# Patient Record
Sex: Female | Born: 2000 | Hispanic: Yes | Marital: Single | Smoking: Never smoker
Health system: Southern US, Community
[De-identification: ages and names within clinical notes are randomized; demographics above are authoritative.]

## PROBLEM LIST (undated history)

## (undated) DIAGNOSIS — O1410 Severe pre-eclampsia, unspecified trimester: Secondary | ICD-10-CM

## (undated) DIAGNOSIS — T7840XA Allergy, unspecified, initial encounter: Secondary | ICD-10-CM

## (undated) DIAGNOSIS — F419 Anxiety disorder, unspecified: Secondary | ICD-10-CM

## (undated) DIAGNOSIS — E669 Obesity, unspecified: Secondary | ICD-10-CM

## (undated) DIAGNOSIS — F32A Depression, unspecified: Secondary | ICD-10-CM

## (undated) DIAGNOSIS — T7421XA Adult sexual abuse, confirmed, initial encounter: Secondary | ICD-10-CM

## (undated) HISTORY — PX: NO PAST SURGERIES: SHX2092

## (undated) HISTORY — DX: Severe pre-eclampsia, unspecified trimester: O14.10

---
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
## 2022-01-27 ENCOUNTER — Ambulatory Visit (INDEPENDENT_AMBULATORY_CARE_PROVIDER_SITE_OTHER): Payer: Medicaid Other

## 2022-01-27 DIAGNOSIS — Z3201 Encounter for pregnancy test, result positive: Secondary | ICD-10-CM | POA: Diagnosis not present

## 2022-01-27 DIAGNOSIS — Z3200 Encounter for pregnancy test, result unknown: Secondary | ICD-10-CM

## 2022-01-27 DIAGNOSIS — Z3490 Encounter for supervision of normal pregnancy, unspecified, unspecified trimester: Secondary | ICD-10-CM

## 2022-01-27 LAB — POCT PREGNANCY, URINE: Preg Test, Ur: POSITIVE — AB

## 2022-01-27 NOTE — Progress Notes (Signed)
Possible Pregnancy  Here today for pregnancy confirmation. UPT in office today is positive. Pt reports first positive home UPT 2 weeks ago.  Reviewed dating with patient:   LMP: 12/28/21 approx Dating [place] scheduled for 02/10/22  Reports left lower back pain intermittently over past 4 days. Patient reports pain has ranged from 6-8 on pain scale out of 10. Reviewed need to present to MAU if pain returns or with any vaginal bleeding due to risk for ectopic pregnancy vs miscarriage. OB history reviewed; single vaginal delivery 05/22/21. Reviewed medications and allergies with patient; list of medications safe to take during pregnancy given. Will schedule prenatal care following dating [place].  Marjo Bicker, RN 01/27/2022  1:20 PM

## 2022-01-27 NOTE — Patient Instructions (Signed)

## 2022-02-04 ENCOUNTER — Inpatient Hospital Stay (HOSPITAL_COMMUNITY)
Admission: AD | Admit: 2022-02-04 | Discharge: 2022-02-04 | Disposition: A | Discharge status: Home / Self Care | Payer: Medicaid Other | Attending: Obstetrics and Gynecology | Admitting: Obstetrics and Gynecology

## 2022-02-04 ENCOUNTER — Inpatient Hospital Stay (HOSPITAL_COMMUNITY): Payer: Medicaid Other

## 2022-02-04 ENCOUNTER — Encounter (HOSPITAL_COMMUNITY): Payer: Self-pay | Admitting: *Deleted

## 2022-02-04 DIAGNOSIS — Z3A10 10 weeks gestation of pregnancy: Secondary | ICD-10-CM | POA: Insufficient documentation

## 2022-02-04 DIAGNOSIS — O23591 Infection of other part of genital tract in pregnancy, first trimester: Secondary | ICD-10-CM | POA: Diagnosis not present

## 2022-02-04 DIAGNOSIS — Z3491 Encounter for supervision of normal pregnancy, unspecified, first trimester: Secondary | ICD-10-CM | POA: Diagnosis not present

## 2022-02-04 DIAGNOSIS — M549 Dorsalgia, unspecified: Secondary | ICD-10-CM | POA: Insufficient documentation

## 2022-02-04 DIAGNOSIS — O219 Vomiting of pregnancy, unspecified: Secondary | ICD-10-CM | POA: Diagnosis not present

## 2022-02-04 DIAGNOSIS — O218 Other vomiting complicating pregnancy: Secondary | ICD-10-CM | POA: Diagnosis not present

## 2022-02-04 DIAGNOSIS — B9689 Other specified bacterial agents as the cause of diseases classified elsewhere: Secondary | ICD-10-CM | POA: Diagnosis not present

## 2022-02-04 DIAGNOSIS — O26891 Other specified pregnancy related conditions, first trimester: Secondary | ICD-10-CM | POA: Insufficient documentation

## 2022-02-04 DIAGNOSIS — O99891 Other specified diseases and conditions complicating pregnancy: Secondary | ICD-10-CM | POA: Diagnosis not present

## 2022-02-04 HISTORY — DX: Depression, unspecified: F32.A

## 2022-02-04 LAB — WET PREP, GENITAL
Sperm: NONE SEEN
Trich, Wet Prep: NONE SEEN
WBC, Wet Prep HPF POC: <10 (ref <10)
Yeast Wet Prep HPF POC: NONE SEEN

## 2022-02-04 LAB — CBC
HCT: 37.8 % (ref 36.0–46.0)
Hemoglobin: 13 g/dL (ref 12.0–15.0)
MCH: 29.3 pg (ref 26.0–34.0)
MCHC: 34.4 g/dL (ref 30.0–36.0)
MCV: 85.1 fL (ref 80.0–100.0)
Platelets: 297 10*3/uL (ref 150–400)
RBC: 4.44 MIL/uL (ref 3.87–5.11)
RDW: 13.8 % (ref 11.5–15.5)
WBC: 9.8 10*3/uL (ref 4.0–10.5)
nRBC: 0 % (ref 0.0–0.2)

## 2022-02-04 LAB — URINALYSIS, ROUTINE W REFLEX MICROSCOPIC
Bilirubin Urine: NEGATIVE
Glucose, UA: NEGATIVE mg/dL
Hgb urine dipstick: NEGATIVE
Ketones, ur: 5 mg/dL — AB
Leukocytes,Ua: NEGATIVE
Nitrite: NEGATIVE
Protein, ur: NEGATIVE mg/dL
Specific Gravity, Urine: 1.014 (ref 1.005–1.030)
pH: 6 (ref 5.0–8.0)

## 2022-02-04 LAB — HCG, QUANTITATIVE, PREGNANCY: hCG, Beta Chain, Quant, S: 127766 m[IU]/mL — ABNORMAL HIGH (ref <5)

## 2022-02-04 MED ORDER — METOCLOPRAMIDE HCL 10 MG PO TABS
10.0000 mg | ORAL_TABLET | Freq: Three times a day (TID) | ORAL | 0 refills | Status: DC | PRN
Start: 2022-02-04 — End: 2022-06-16

## 2022-02-04 MED ORDER — METRONIDAZOLE 500 MG PO TABS
500.0000 mg | ORAL_TABLET | Freq: Two times a day (BID) | ORAL | 0 refills | Status: DC
Start: 2022-02-04 — End: 2022-06-16

## 2022-02-04 MED ORDER — METOCLOPRAMIDE HCL 10 MG PO TABS
10.0000 mg | ORAL_TABLET | Freq: Once | ORAL | Status: AC
  Administered 2022-02-04: 10 mg via ORAL
  Filled 2022-02-04: qty 1
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
## 2022-02-04 NOTE — Discharge Instructions (Signed)
Return to care  If you have heavier bleeding that soaks through more than 2 pads per hour for an hour or more If you bleed so much that you feel like you might pass out or you do pass out If you have significant abdominal pain that is not improved with Tylenol    Safe Medications in Pregnancy   Acne: Benzoyl Peroxide Salicylic Acid  Backache/Headache: Tylenol: 2 regular strength every 4 hours OR              2 Extra strength every 6 hours  Colds/Coughs/Allergies: Benadryl (alcohol free) 25 mg every 6 hours as needed Breath right strips Claritin Cepacol throat lozenges Chloraseptic throat spray Cold-Eeze- up to three times per day Cough drops, alcohol free Flonase (by prescription only) Guaifenesin Mucinex Robitussin DM (plain only, alcohol free) Saline nasal spray/drops Sudafed (pseudoephedrine) & Actifed ** use only after [redacted] weeks gestation and if you do not have high blood pressure Tylenol Vicks Vaporub Zinc lozenges Zyrtec   Constipation: Colace Ducolax suppositories Fleet enema Glycerin suppositories Metamucil Milk of magnesia Miralax Senokot Smooth move tea  Diarrhea: Kaopectate Imodium A-D  *NO pepto Bismol  Hemorrhoids: Anusol Anusol HC Preparation H Tucks  Indigestion: Tums Maalox Mylanta Zantac  Pepcid  Insomnia: Benadryl (alcohol free) 25mg every 6 hours as needed Tylenol PM Unisom, no Gelcaps  Leg Cramps: Tums MagGel  Nausea/Vomiting:  Bonine Dramamine Emetrol Ginger extract Sea bands Meclizine  Nausea medication to take during pregnancy:  Unisom (doxylamine succinate 25 mg tablets) Take one tablet daily at bedtime. If symptoms are not adequately controlled, the dose can be increased to a maximum recommended dose of two tablets daily (1/2 tablet in the morning, 1/2 tablet mid-afternoon and one at bedtime). Vitamin B6 100mg tablets. Take one tablet twice a day (up to 200 mg per day).  Skin Rashes: Aveeno  products Benadryl cream or 25mg every 6 hours as needed Calamine Lotion 1% cortisone cream  Yeast infection: Gyne-lotrimin 7 Monistat 7  Gum/tooth pain: Anbesol  **If taking multiple medications, please check labels to avoid duplicating the same active ingredients **take medication as directed on the label ** Do not exceed 4000 mg of tylenol in 24 hours **Do not take medications that contain aspirin or ibuprofen    

## 2022-02-04 NOTE — MAU Note (Signed)
Tara Cohen is a 21 y.o. at [redacted]w[redacted]d here in MAU reporting: pain in left flank started last wk, getting worse. +Left CVA tendernes LMP: 4/15? After Easter. No bleeding. Onset of complaint: wk ago Pain score: 8 Vitals:   02/04/22 1542  BP: 130/79  Pulse: 61  Resp: 18  Temp: 98.5 F (36.9 C)  SpO2: 99%     Lab orders placed from triage:  see NP note

## 2022-02-05 LAB — GC/CHLAMYDIA PROBE AMP (~~LOC~~) NOT AT ARMC
Chlamydia: NEGATIVE
Comment: NEGATIVE
Comment: NORMAL
Neisseria Gonorrhea: NEGATIVE

## 2022-02-10 ENCOUNTER — Other Ambulatory Visit: Payer: Medicaid Other

## 2022-03-18 ENCOUNTER — Telehealth (INDEPENDENT_AMBULATORY_CARE_PROVIDER_SITE_OTHER): Payer: Medicaid Other | Admitting: *Deleted

## 2022-03-18 DIAGNOSIS — Z8759 Personal history of other complications of pregnancy, childbirth and the puerperium: Secondary | ICD-10-CM

## 2022-03-18 DIAGNOSIS — O1400 Mild to moderate pre-eclampsia, unspecified trimester: Secondary | ICD-10-CM | POA: Insufficient documentation

## 2022-03-18 DIAGNOSIS — F332 Major depressive disorder, recurrent severe without psychotic features: Secondary | ICD-10-CM

## 2022-03-18 DIAGNOSIS — O0990 Supervision of high risk pregnancy, unspecified, unspecified trimester: Secondary | ICD-10-CM | POA: Insufficient documentation

## 2022-03-18 DIAGNOSIS — O99340 Other mental disorders complicating pregnancy, unspecified trimester: Secondary | ICD-10-CM

## 2022-03-18 DIAGNOSIS — O09299 Supervision of pregnancy with other poor reproductive or obstetric history, unspecified trimester: Secondary | ICD-10-CM

## 2022-03-18 DIAGNOSIS — F32A Depression, unspecified: Secondary | ICD-10-CM

## 2022-03-18 DIAGNOSIS — O09899 Supervision of other high risk pregnancies, unspecified trimester: Secondary | ICD-10-CM | POA: Insufficient documentation

## 2022-03-18 DIAGNOSIS — Z3A00 Weeks of gestation of pregnancy not specified: Secondary | ICD-10-CM

## 2022-03-18 DIAGNOSIS — F411 Generalized anxiety disorder: Secondary | ICD-10-CM

## 2022-03-18 DIAGNOSIS — Z7289 Other problems related to lifestyle: Secondary | ICD-10-CM

## 2022-03-18 MED ORDER — PRENATAL VITAMINS 28-0.8 MG PO TABS: 1.0000 | ORAL_TABLET | Freq: Every day | ORAL | 11 refills | Status: DC
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
## 2022-03-18 NOTE — Patient Instructions (Signed)
  At our [facility], we work as an integrated team, providing care to address both physical and emotional health. Your medical provider may refer you to see our Behavioral Health Clinician (BHC) on the same day you see your medical provider, as availability permits; often scheduled virtually at your convenience.  Our BHC is available to all patients, visits generally last between 20-30 minutes, but can be longer or shorter, depending on patient need. The BHC offers help with stress management, coping with symptoms of depression and anxiety, major life changes , sleep issues, changing risky behavior, grief and loss, life stress, working on personal life goals, and  behavioral health issues, as these all affect your overall health and wellness.  The BHC is NOT available for the following: FMLA paperwork, court-ordered evaluations, specialty assessments (custody or disability), letters to employers, or obtaining certification for an emotional support animal. The BHC does not provide long-term therapy. You have the right to refuse integrated behavioral health services, or to reschedule to see the BHC at a later date.  Confidentiality exception: If it is suspected that a child or disabled adult is being abused or neglected, we are required by law to report that to either Child Protective Services or Adult Protective Services.  If you have a diagnosis of Bipolar affective disorder, Schizophrenia, or recurrent Major depressive disorder, we will recommend that you establish care with a psychiatrist, as these are lifelong, chronic conditions, and we want your overall emotional health and medications to be more closely monitored. If you anticipate needing extended maternity leave due to mental health issues postpartum, it it recommended you inform your medical provider, so we can put in a referral to a psychiatrist as soon as possible. The BHC is unable to recommend an extended maternity leave for mental  health issues. Your medical provider or BHC may refer you to a therapist for ongoing, traditional therapy, or to a psychiatrist, for medication management, if it would benefit your overall health. Depending on your insurance, you may have a copay or be charged a deductible, depending on your insurance, to see the BHC. If you are uninsured, it is recommended that you apply for financial assistance. (Forms may be requested at the front desk for in-person visits, via MyChart, or request a form during a virtual visit).  If you see the BHC more than 6 times, you will have to complete a comprehensive clinical assessment interview with the BHC to resume integrated services.  For virtual visits with the BHC, you must be physically in the state of [place] at the time of the visit. For example, if you live in [place], you will have to do an in-person visit with the BHC, and your out-of-state insurance may not cover behavioral health services in [place]. If you are going out of the state or country for any reason, the BHC may see you virtually when you return to [place], but not while you are physically outside of [place].    [email or web address] is a website with information to help you prepare for Labor and delivery, patient information, visitor information and more.    Here is a link to the Pregnancy Navigators . Please Fill out prior to your New OB appointment.   English Link: [email or web address]  Spanish Link: [email or web address]  

## 2022-03-19 NOTE — BH Specialist Note (Signed)
Pt did not arrive to video visit and did not answer the phone; Left HIPPA-compliant message to call back Jasemine Nawaz from [facility] at  [phone] (Romonda Parker's office).  ?; left MyChart message for patient.  ? ?

## 2022-03-31 ENCOUNTER — Ambulatory Visit: Payer: Medicaid Other | Admitting: Clinical

## 2022-03-31 DIAGNOSIS — Z91199 Patient's noncompliance with other medical treatment and regimen due to unspecified reason: Secondary | ICD-10-CM

## 2022-04-07 ENCOUNTER — Ambulatory Visit: Payer: Medicaid Other | Attending: Obstetrics and Gynecology

## 2022-04-07 ENCOUNTER — Ambulatory Visit: Payer: Medicaid Other | Admitting: *Deleted

## 2022-04-07 ENCOUNTER — Other Ambulatory Visit: Payer: Self-pay | Admitting: *Deleted

## 2022-04-07 ENCOUNTER — Encounter: Payer: Self-pay | Admitting: *Deleted

## 2022-04-07 VITALS — BP 113/63 | HR 70

## 2022-04-07 DIAGNOSIS — O0990 Supervision of high risk pregnancy, unspecified, unspecified trimester: Secondary | ICD-10-CM

## 2022-04-07 DIAGNOSIS — O09899 Supervision of other high risk pregnancies, unspecified trimester: Secondary | ICD-10-CM

## 2022-04-07 DIAGNOSIS — O09892 Supervision of other high risk pregnancies, second trimester: Secondary | ICD-10-CM

## 2022-04-07 DIAGNOSIS — Z362 Encounter for other antenatal screening follow-up: Secondary | ICD-10-CM

## 2022-04-07 DIAGNOSIS — O09292 Supervision of pregnancy with other poor reproductive or obstetric history, second trimester: Secondary | ICD-10-CM

## 2022-04-07 DIAGNOSIS — Z8759 Personal history of other complications of pregnancy, childbirth and the puerperium: Secondary | ICD-10-CM

## 2022-04-08 ENCOUNTER — Encounter: Payer: Medicaid Other | Admitting: Obstetrics and Gynecology

## 2022-04-08 DIAGNOSIS — Z8759 Personal history of other complications of pregnancy, childbirth and the puerperium: Secondary | ICD-10-CM

## 2022-04-08 DIAGNOSIS — O0990 Supervision of high risk pregnancy, unspecified, unspecified trimester: Secondary | ICD-10-CM

## 2022-04-08 DIAGNOSIS — O09899 Supervision of other high risk pregnancies, unspecified trimester: Secondary | ICD-10-CM

## 2022-04-29 NOTE — BH Specialist Note (Signed)
Pt did not arrive to video visit and did not answer the phone; Left HIPPA-compliant message to call back Waynetta Metheny from [facility] at  [phone] (Aerabella Galasso's office).  ?; left MyChart message for patient.  ? ?

## 2022-05-05 ENCOUNTER — Encounter: Payer: Self-pay | Admitting: *Deleted

## 2022-05-05 ENCOUNTER — Ambulatory Visit: Payer: Medicaid Other | Attending: Obstetrics

## 2022-05-05 ENCOUNTER — Other Ambulatory Visit: Payer: Self-pay | Admitting: *Deleted

## 2022-05-05 ENCOUNTER — Ambulatory Visit: Payer: Medicaid Other | Admitting: *Deleted

## 2022-05-05 VITALS — BP 119/56 | HR 75

## 2022-05-05 DIAGNOSIS — O09892 Supervision of other high risk pregnancies, second trimester: Secondary | ICD-10-CM | POA: Insufficient documentation

## 2022-05-05 DIAGNOSIS — O09292 Supervision of pregnancy with other poor reproductive or obstetric history, second trimester: Secondary | ICD-10-CM | POA: Insufficient documentation

## 2022-05-05 DIAGNOSIS — Z3A23 23 weeks gestation of pregnancy: Secondary | ICD-10-CM

## 2022-05-05 DIAGNOSIS — Z362 Encounter for other antenatal screening follow-up: Secondary | ICD-10-CM | POA: Diagnosis present

## 2022-05-05 DIAGNOSIS — Z8759 Personal history of other complications of pregnancy, childbirth and the puerperium: Secondary | ICD-10-CM | POA: Insufficient documentation

## 2022-05-05 DIAGNOSIS — O0990 Supervision of high risk pregnancy, unspecified, unspecified trimester: Secondary | ICD-10-CM | POA: Insufficient documentation

## 2022-05-05 DIAGNOSIS — O09899 Supervision of other high risk pregnancies, unspecified trimester: Secondary | ICD-10-CM

## 2022-05-05 DIAGNOSIS — O09293 Supervision of pregnancy with other poor reproductive or obstetric history, third trimester: Secondary | ICD-10-CM

## 2022-05-11 ENCOUNTER — Encounter: Payer: Medicaid Other | Admitting: Family Medicine

## 2022-05-12 ENCOUNTER — Ambulatory Visit: Payer: Medicaid Other | Admitting: Clinical

## 2022-05-12 DIAGNOSIS — Z91199 Patient's noncompliance with other medical treatment and regimen due to unspecified reason: Secondary | ICD-10-CM

## 2022-06-16 ENCOUNTER — Ambulatory Visit: Payer: Medicaid Other | Attending: Obstetrics

## 2022-06-16 ENCOUNTER — Other Ambulatory Visit (HOSPITAL_COMMUNITY)
Admission: RE | Admit: 2022-06-16 | Discharge: 2022-06-16 | Disposition: A | Discharge status: Home / Self Care | Payer: Medicaid Other | Source: Ambulatory Visit | Attending: Obstetrics and Gynecology | Admitting: Obstetrics and Gynecology

## 2022-06-16 ENCOUNTER — Ambulatory Visit: Payer: Medicaid Other | Admitting: *Deleted

## 2022-06-16 ENCOUNTER — Other Ambulatory Visit: Payer: Self-pay | Admitting: *Deleted

## 2022-06-16 ENCOUNTER — Ambulatory Visit (INDEPENDENT_AMBULATORY_CARE_PROVIDER_SITE_OTHER): Payer: Medicaid Other | Admitting: Family Medicine

## 2022-06-16 VITALS — BP 132/80 | HR 77

## 2022-06-16 VITALS — BP 125/66 | HR 75

## 2022-06-16 DIAGNOSIS — O0990 Supervision of high risk pregnancy, unspecified, unspecified trimester: Secondary | ICD-10-CM | POA: Insufficient documentation

## 2022-06-16 DIAGNOSIS — Z8759 Personal history of other complications of pregnancy, childbirth and the puerperium: Secondary | ICD-10-CM | POA: Insufficient documentation

## 2022-06-16 DIAGNOSIS — Z3A29 29 weeks gestation of pregnancy: Secondary | ICD-10-CM

## 2022-06-16 DIAGNOSIS — Z23 Encounter for immunization: Secondary | ICD-10-CM | POA: Insufficient documentation

## 2022-06-16 DIAGNOSIS — O09293 Supervision of pregnancy with other poor reproductive or obstetric history, third trimester: Secondary | ICD-10-CM | POA: Insufficient documentation

## 2022-06-16 DIAGNOSIS — M5432 Sciatica, left side: Secondary | ICD-10-CM

## 2022-06-16 DIAGNOSIS — O09899 Supervision of other high risk pregnancies, unspecified trimester: Secondary | ICD-10-CM

## 2022-06-16 DIAGNOSIS — M5431 Sciatica, right side: Secondary | ICD-10-CM | POA: Insufficient documentation

## 2022-06-16 DIAGNOSIS — Z3689 Encounter for other specified antenatal screening: Secondary | ICD-10-CM

## 2022-06-16 MED ORDER — GABAPENTIN 100 MG PO CAPS: 100.0000 mg | ORAL_CAPSULE | Freq: Every day | ORAL | 0 refills | Status: DC

## 2022-06-16 MED ORDER — ASPIRIN 81 MG PO TBEC: 81.0000 mg | DELAYED_RELEASE_TABLET | Freq: Every day | ORAL | 12 refills | Status: DC
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
## 2022-06-17 LAB — CMP14+EGFR
ALT: 9 IU/L (ref 0–32)
AST: 13 IU/L (ref 0–40)
Albumin/Globulin Ratio: 1.5 (ref 1.2–2.2)
Albumin: 4 g/dL (ref 4.0–5.0)
Alkaline Phosphatase: 99 IU/L (ref 42–106)
BUN/Creatinine Ratio: 14 (ref 9–23)
BUN: 7 mg/dL (ref 6–20)
Bilirubin Total: 0.5 mg/dL (ref 0.0–1.2)
CO2: 18 mmol/L — ABNORMAL LOW (ref 20–29)
Calcium: 8.8 mg/dL (ref 8.7–10.2)
Chloride: 103 mmol/L (ref 96–106)
Creatinine, Ser: 0.49 mg/dL — ABNORMAL LOW (ref 0.57–1.00)
Globulin, Total: 2.7 g/dL (ref 1.5–4.5)
Glucose: 69 mg/dL — ABNORMAL LOW (ref 70–99)
Potassium: 4.1 mmol/L (ref 3.5–5.2)
Sodium: 136 mmol/L (ref 134–144)
Total Protein: 6.7 g/dL (ref 6.0–8.5)
eGFR: 139 mL/min/{1.73_m2} (ref >59)

## 2022-06-17 LAB — PROTEIN / CREATININE RATIO, URINE
Creatinine, Urine: 70.2 mg/dL
Protein, Ur: 14.5 mg/dL
Protein/Creat Ratio: 207 mg/g creat — ABNORMAL HIGH (ref 0–200)

## 2022-06-17 LAB — HCV INTERPRETATION

## 2022-06-17 LAB — CBC/D/PLT+RPR+RH+ABO+RUBIGG...
Antibody Screen: NEGATIVE
Basophils Absolute: 0 10*3/uL (ref 0.0–0.2)
Basos: 0 %
EOS (ABSOLUTE): 0.1 10*3/uL (ref 0.0–0.4)
Eos: 1 %
HCV Ab: NONREACTIVE
HIV Screen 4th Generation wRfx: NONREACTIVE
Hematocrit: 32.9 % — ABNORMAL LOW (ref 34.0–46.6)
Hemoglobin: 11.1 g/dL (ref 11.1–15.9)
Hepatitis B Surface Ag: NEGATIVE
Immature Grans (Abs): 0 10*3/uL (ref 0.0–0.1)
Immature Granulocytes: 1 %
Lymphocytes Absolute: 1.9 10*3/uL (ref 0.7–3.1)
Lymphs: 22 %
MCH: 29.1 pg (ref 26.6–33.0)
MCHC: 33.7 g/dL (ref 31.5–35.7)
MCV: 86 fL (ref 79–97)
Monocytes Absolute: 0.5 10*3/uL (ref 0.1–0.9)
Monocytes: 6 %
Neutrophils Absolute: 6.2 10*3/uL (ref 1.4–7.0)
Neutrophils: 70 %
Platelets: 268 10*3/uL (ref 150–450)
RBC: 3.82 x10E6/uL (ref 3.77–5.28)
RDW: 11.8 % (ref 11.7–15.4)
RPR Ser Ql: NONREACTIVE
Rh Factor: POSITIVE
Rubella Antibodies, IGG: 1.67 index (ref >0.99)
WBC: 8.7 10*3/uL (ref 3.4–10.8)

## 2022-06-17 LAB — HEMOGLOBIN A1C
Est. average glucose Bld gHb Est-mCnc: 97 mg/dL
Hgb A1c MFr Bld: 5 % (ref 4.8–5.6)

## 2022-06-18 LAB — CERVICOVAGINAL ANCILLARY ONLY
Bacterial Vaginitis (gardnerella): POSITIVE — AB
Candida Glabrata: NEGATIVE
Candida Vaginitis: NEGATIVE
Chlamydia: NEGATIVE
Comment: NEGATIVE
Comment: NEGATIVE
Comment: NEGATIVE
Comment: NEGATIVE
Comment: NEGATIVE
Comment: NORMAL
Neisseria Gonorrhea: NEGATIVE
Trichomonas: NEGATIVE

## 2022-06-18 LAB — URINE CULTURE, OB REFLEX

## 2022-06-18 LAB — CULTURE, OB URINE

## 2022-06-20 ENCOUNTER — Telehealth: Payer: Self-pay | Admitting: Family Medicine

## 2022-06-20 MED ORDER — METRONIDAZOLE 500 MG PO TABS: 500.0000 mg | ORAL_TABLET | Freq: Two times a day (BID) | ORAL | 0 refills | Status: DC

## 2022-06-20 NOTE — Telephone Encounter (Signed)
Called patient to discuss wet prep results.  Positive for BV.  We will send Flagyl 500 mg twice daily for 7 days.  No further questions or concerns.

## 2022-06-22 ENCOUNTER — Encounter: Payer: Self-pay | Admitting: *Deleted

## 2022-06-22 ENCOUNTER — Other Ambulatory Visit: Payer: Self-pay | Admitting: General Practice

## 2022-06-22 DIAGNOSIS — O0990 Supervision of high risk pregnancy, unspecified, unspecified trimester: Secondary | ICD-10-CM

## 2022-06-23 ENCOUNTER — Other Ambulatory Visit: Payer: Self-pay

## 2022-06-23 ENCOUNTER — Other Ambulatory Visit: Payer: Medicaid Other

## 2022-06-23 DIAGNOSIS — O0990 Supervision of high risk pregnancy, unspecified, unspecified trimester: Secondary | ICD-10-CM

## 2022-06-23 LAB — PANORAMA PRENATAL TEST FULL PANEL:PANORAMA TEST PLUS 5 ADDITIONAL MICRODELETIONS: FETAL FRACTION: 20.7

## 2022-06-24 LAB — GLUCOSE TOLERANCE, 2 HOURS W/ 1HR
Glucose, 1 hour: 59 mg/dL — ABNORMAL LOW (ref 70–179)
Glucose, 2 hour: 70 mg/dL (ref 70–152)
Glucose, Fasting: 66 mg/dL — ABNORMAL LOW (ref 70–91)

## 2022-06-26 LAB — HORIZON CUSTOM: REPORT SUMMARY: NEGATIVE

## 2022-07-03 ENCOUNTER — Ambulatory Visit (INDEPENDENT_AMBULATORY_CARE_PROVIDER_SITE_OTHER): Payer: Medicaid Other | Admitting: Certified Nurse Midwife

## 2022-07-03 ENCOUNTER — Other Ambulatory Visit: Payer: Self-pay

## 2022-07-03 VITALS — BP 125/70 | HR 69 | Wt 174.7 lb

## 2022-07-03 DIAGNOSIS — Z3A32 32 weeks gestation of pregnancy: Secondary | ICD-10-CM

## 2022-07-03 DIAGNOSIS — M5430 Sciatica, unspecified side: Secondary | ICD-10-CM

## 2022-07-03 DIAGNOSIS — Z3A31 31 weeks gestation of pregnancy: Secondary | ICD-10-CM

## 2022-07-03 DIAGNOSIS — Z8751 Personal history of pre-term labor: Secondary | ICD-10-CM

## 2022-07-03 DIAGNOSIS — Z3493 Encounter for supervision of normal pregnancy, unspecified, third trimester: Secondary | ICD-10-CM

## 2022-07-03 DIAGNOSIS — Z8759 Personal history of other complications of pregnancy, childbirth and the puerperium: Secondary | ICD-10-CM

## 2022-07-03 DIAGNOSIS — O0930 Supervision of pregnancy with insufficient antenatal care, unspecified trimester: Secondary | ICD-10-CM
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
## 2022-07-20 ENCOUNTER — Encounter: Payer: Self-pay | Admitting: Advanced Practice Midwife

## 2022-07-20 ENCOUNTER — Ambulatory Visit (INDEPENDENT_AMBULATORY_CARE_PROVIDER_SITE_OTHER): Payer: Medicaid Other | Admitting: Advanced Practice Midwife

## 2022-07-20 ENCOUNTER — Other Ambulatory Visit: Payer: Self-pay

## 2022-07-20 VITALS — BP 133/81 | HR 78 | Wt 177.4 lb

## 2022-07-20 DIAGNOSIS — O09899 Supervision of other high risk pregnancies, unspecified trimester: Secondary | ICD-10-CM

## 2022-07-20 DIAGNOSIS — O0990 Supervision of high risk pregnancy, unspecified, unspecified trimester: Secondary | ICD-10-CM

## 2022-07-20 DIAGNOSIS — O09893 Supervision of other high risk pregnancies, third trimester: Secondary | ICD-10-CM

## 2022-07-20 DIAGNOSIS — Z8759 Personal history of other complications of pregnancy, childbirth and the puerperium: Secondary | ICD-10-CM

## 2022-07-20 DIAGNOSIS — O0993 Supervision of high risk pregnancy, unspecified, third trimester: Secondary | ICD-10-CM

## 2022-07-20 DIAGNOSIS — Z3A34 34 weeks gestation of pregnancy: Secondary | ICD-10-CM

## 2022-07-20 LAB — POCT URINALYSIS DIP (DEVICE)
Bilirubin Urine: NEGATIVE
Glucose, UA: NEGATIVE mg/dL
Hgb urine dipstick: NEGATIVE
Ketones, ur: NEGATIVE mg/dL
Nitrite: NEGATIVE
Protein, ur: NEGATIVE mg/dL
Specific Gravity, Urine: 1.025 (ref 1.005–1.030)
Urobilinogen, UA: 0.2 mg/dL (ref 0.0–1.0)
pH: 6 (ref 5.0–8.0)

## 2022-07-20 MED ORDER — BLOOD PRESSURE KIT DEVI: 1.0000 | Freq: Every day | 0 refills | Status: DC
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
## 2022-07-21 ENCOUNTER — Ambulatory Visit: Payer: Medicaid Other | Attending: Maternal & Fetal Medicine

## 2022-07-21 ENCOUNTER — Ambulatory Visit: Payer: Medicaid Other | Admitting: *Deleted

## 2022-07-21 VITALS — BP 116/65 | HR 62

## 2022-07-21 DIAGNOSIS — O09293 Supervision of pregnancy with other poor reproductive or obstetric history, third trimester: Secondary | ICD-10-CM | POA: Insufficient documentation

## 2022-07-21 DIAGNOSIS — Z8759 Personal history of other complications of pregnancy, childbirth and the puerperium: Secondary | ICD-10-CM | POA: Insufficient documentation

## 2022-07-21 DIAGNOSIS — O0990 Supervision of high risk pregnancy, unspecified, unspecified trimester: Secondary | ICD-10-CM | POA: Diagnosis present

## 2022-07-21 DIAGNOSIS — Z3689 Encounter for other specified antenatal screening: Secondary | ICD-10-CM | POA: Diagnosis present

## 2022-07-21 DIAGNOSIS — O09899 Supervision of other high risk pregnancies, unspecified trimester: Secondary | ICD-10-CM | POA: Insufficient documentation

## 2022-07-21 DIAGNOSIS — O09213 Supervision of pregnancy with history of pre-term labor, third trimester: Secondary | ICD-10-CM | POA: Diagnosis not present

## 2022-07-21 DIAGNOSIS — Z3A34 34 weeks gestation of pregnancy: Secondary | ICD-10-CM | POA: Diagnosis not present

## 2022-07-21 DIAGNOSIS — Z362 Encounter for other antenatal screening follow-up: Secondary | ICD-10-CM

## 2022-08-03 ENCOUNTER — Ambulatory Visit (INDEPENDENT_AMBULATORY_CARE_PROVIDER_SITE_OTHER): Payer: Medicaid Other | Admitting: Family Medicine

## 2022-08-03 ENCOUNTER — Encounter: Payer: Self-pay | Admitting: Family Medicine

## 2022-08-03 ENCOUNTER — Other Ambulatory Visit (HOSPITAL_COMMUNITY)
Admission: RE | Admit: 2022-08-03 | Discharge: 2022-08-03 | Disposition: A | Discharge status: Home / Self Care | Payer: Medicaid Other | Source: Ambulatory Visit | Attending: Family Medicine | Admitting: Family Medicine

## 2022-08-03 ENCOUNTER — Other Ambulatory Visit: Payer: Self-pay

## 2022-08-03 VITALS — BP 131/84 | HR 81 | Wt 181.9 lb

## 2022-08-03 DIAGNOSIS — O0990 Supervision of high risk pregnancy, unspecified, unspecified trimester: Secondary | ICD-10-CM | POA: Diagnosis present

## 2022-08-03 DIAGNOSIS — O0993 Supervision of high risk pregnancy, unspecified, third trimester: Secondary | ICD-10-CM

## 2022-08-03 DIAGNOSIS — O09899 Supervision of other high risk pregnancies, unspecified trimester: Secondary | ICD-10-CM

## 2022-08-03 DIAGNOSIS — Z8759 Personal history of other complications of pregnancy, childbirth and the puerperium: Secondary | ICD-10-CM

## 2022-08-03 DIAGNOSIS — Z3A37 37 weeks gestation of pregnancy: Secondary | ICD-10-CM

## 2022-08-03 DIAGNOSIS — Z3A36 36 weeks gestation of pregnancy: Secondary | ICD-10-CM

## 2022-08-03 DIAGNOSIS — O09893 Supervision of other high risk pregnancies, third trimester: Secondary | ICD-10-CM
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
## 2022-08-04 LAB — GC/CHLAMYDIA PROBE AMP (~~LOC~~) NOT AT ARMC
Chlamydia: NEGATIVE
Comment: NEGATIVE
Comment: NORMAL
Neisseria Gonorrhea: NEGATIVE

## 2022-08-07 LAB — CULTURE, BETA STREP (GROUP B ONLY): Strep Gp B Culture: NEGATIVE

## 2022-08-10 ENCOUNTER — Other Ambulatory Visit: Payer: Self-pay

## 2022-08-10 ENCOUNTER — Ambulatory Visit (INDEPENDENT_AMBULATORY_CARE_PROVIDER_SITE_OTHER): Payer: Medicaid Other | Admitting: Family Medicine

## 2022-08-10 VITALS — BP 129/72 | HR 72 | Wt 181.3 lb

## 2022-08-10 DIAGNOSIS — Z3A37 37 weeks gestation of pregnancy: Secondary | ICD-10-CM | POA: Diagnosis not present

## 2022-08-10 DIAGNOSIS — O0990 Supervision of high risk pregnancy, unspecified, unspecified trimester: Secondary | ICD-10-CM

## 2022-08-10 DIAGNOSIS — O09893 Supervision of other high risk pregnancies, third trimester: Secondary | ICD-10-CM

## 2022-08-10 DIAGNOSIS — O09899 Supervision of other high risk pregnancies, unspecified trimester: Secondary | ICD-10-CM

## 2022-08-10 DIAGNOSIS — O0993 Supervision of high risk pregnancy, unspecified, third trimester: Secondary | ICD-10-CM | POA: Diagnosis not present

## 2022-08-10 DIAGNOSIS — Z8759 Personal history of other complications of pregnancy, childbirth and the puerperium: Secondary | ICD-10-CM
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
## 2022-08-11 ENCOUNTER — Other Ambulatory Visit: Payer: Self-pay

## 2022-08-11 ENCOUNTER — Inpatient Hospital Stay (HOSPITAL_COMMUNITY): Payer: Medicaid Other | Admitting: Anesthesiology

## 2022-08-11 ENCOUNTER — Inpatient Hospital Stay (HOSPITAL_COMMUNITY)
Admission: AD | Admit: 2022-08-11 | Discharge: 2022-08-12 | DRG: 807 | Disposition: A | Discharge status: Home / Self Care | Payer: Medicaid Other | Attending: Family Medicine | Admitting: Family Medicine

## 2022-08-11 ENCOUNTER — Encounter (HOSPITAL_COMMUNITY): Payer: Self-pay | Admitting: Obstetrics & Gynecology

## 2022-08-11 DIAGNOSIS — Z8759 Personal history of other complications of pregnancy, childbirth and the puerperium: Secondary | ICD-10-CM

## 2022-08-11 DIAGNOSIS — Z7982 Long term (current) use of aspirin: Secondary | ICD-10-CM

## 2022-08-11 DIAGNOSIS — Z3A37 37 weeks gestation of pregnancy: Secondary | ICD-10-CM

## 2022-08-11 DIAGNOSIS — O09899 Supervision of other high risk pregnancies, unspecified trimester: Secondary | ICD-10-CM

## 2022-08-11 DIAGNOSIS — O1404 Mild to moderate pre-eclampsia, complicating childbirth: Secondary | ICD-10-CM | POA: Diagnosis present

## 2022-08-11 DIAGNOSIS — O26893 Other specified pregnancy related conditions, third trimester: Secondary | ICD-10-CM | POA: Diagnosis present

## 2022-08-11 DIAGNOSIS — O1400 Mild to moderate pre-eclampsia, unspecified trimester: Secondary | ICD-10-CM | POA: Diagnosis present

## 2022-08-11 DIAGNOSIS — O0990 Supervision of high risk pregnancy, unspecified, unspecified trimester: Principal | ICD-10-CM

## 2022-08-11 LAB — PROTEIN / CREATININE RATIO, URINE
Creatinine, Urine: 52 mg/dL
Protein Creatinine Ratio: 0.37 mg/mg{Cre} — ABNORMAL HIGH (ref 0.00–0.15)
Total Protein, Urine: 19 mg/dL

## 2022-08-11 LAB — COMPREHENSIVE METABOLIC PANEL
ALT: 14 U/L (ref 0–44)
AST: 19 U/L (ref 15–41)
Albumin: 2.9 g/dL — ABNORMAL LOW (ref 3.5–5.0)
Alkaline Phosphatase: 200 U/L — ABNORMAL HIGH (ref 38–126)
Anion gap: 11 (ref 5–15)
BUN: 9 mg/dL (ref 6–20)
CO2: 19 mmol/L — ABNORMAL LOW (ref 22–32)
Calcium: 8.8 mg/dL — ABNORMAL LOW (ref 8.9–10.3)
Chloride: 107 mmol/L (ref 98–111)
Creatinine, Ser: 0.53 mg/dL (ref 0.44–1.00)
GFR, Estimated: >60 mL/min (ref >60)
Glucose, Bld: 79 mg/dL (ref 70–99)
Potassium: 3.9 mmol/L (ref 3.5–5.1)
Sodium: 137 mmol/L (ref 135–145)
Total Bilirubin: 0.6 mg/dL (ref 0.3–1.2)
Total Protein: 6.1 g/dL — ABNORMAL LOW (ref 6.5–8.1)

## 2022-08-11 LAB — CBC
HCT: 33.2 % — ABNORMAL LOW (ref 36.0–46.0)
Hemoglobin: 10.3 g/dL — ABNORMAL LOW (ref 12.0–15.0)
MCH: 25.9 pg — ABNORMAL LOW (ref 26.0–34.0)
MCHC: 31 g/dL (ref 30.0–36.0)
MCV: 83.4 fL (ref 80.0–100.0)
Platelets: 244 10*3/uL (ref 150–400)
RBC: 3.98 MIL/uL (ref 3.87–5.11)
RDW: 13.1 % (ref 11.5–15.5)
WBC: 7.6 10*3/uL (ref 4.0–10.5)
nRBC: 0 % (ref 0.0–0.2)

## 2022-08-11 LAB — TYPE AND SCREEN
ABO/RH(D): B POS
Antibody Screen: NEGATIVE

## 2022-08-11 LAB — RPR: RPR Ser Ql: NONREACTIVE

## 2022-08-11 MED ORDER — WITCH HAZEL-GLYCERIN EX PADS: 1.0000 | MEDICATED_PAD | CUTANEOUS | Status: DC | PRN

## 2022-08-11 MED ORDER — SIMETHICONE 80 MG PO CHEW: 80.0000 mg | CHEWABLE_TABLET | ORAL | Status: DC | PRN

## 2022-08-11 MED ORDER — LACTATED RINGERS IV SOLN: 500.0000 mL | Freq: Once | INTRAVENOUS | Status: DC

## 2022-08-11 MED ORDER — ONDANSETRON HCL 4 MG PO TABS: 4.0000 mg | ORAL_TABLET | ORAL | Status: DC | PRN

## 2022-08-11 MED ORDER — ZOLPIDEM TARTRATE 5 MG PO TABS: 5.0000 mg | ORAL_TABLET | Freq: Every evening | ORAL | Status: DC | PRN

## 2022-08-11 MED ORDER — EPHEDRINE 5 MG/ML INJ: 10.0000 mg | INTRAVENOUS | Status: DC | PRN

## 2022-08-11 MED ORDER — OXYCODONE-ACETAMINOPHEN 5-325 MG PO TABS: 1.0000 | ORAL_TABLET | ORAL | Status: DC | PRN

## 2022-08-11 MED ORDER — TRANEXAMIC ACID-NACL 1000-0.7 MG/100ML-% IV SOLN
INTRAVENOUS | Status: AC
  Filled 2022-08-11: qty 100

## 2022-08-11 MED ORDER — COCONUT OIL OIL: 1.0000 | TOPICAL_OIL | Status: DC | PRN

## 2022-08-11 MED ORDER — ONDANSETRON HCL 4 MG/2ML IJ SOLN: 4.0000 mg | Freq: Four times a day (QID) | INTRAMUSCULAR | Status: DC | PRN

## 2022-08-11 MED ORDER — FENTANYL-BUPIVACAINE-NACL 0.5-0.125-0.9 MG/250ML-% EP SOLN
EPIDURAL | Status: DC | PRN
  Administered 2022-08-11: 12 mL/h via EPIDURAL

## 2022-08-11 MED ORDER — IBUPROFEN 600 MG PO TABS
600.0000 mg | ORAL_TABLET | Freq: Four times a day (QID) | ORAL | Status: DC
  Administered 2022-08-11 – 2022-08-12 (×5): 600 mg via ORAL
  Filled 2022-08-11 (×5): qty 1

## 2022-08-11 MED ORDER — TETANUS-DIPHTH-ACELL PERTUSSIS 5-2.5-18.5 LF-MCG/0.5 IM SUSY: 0.5000 mL | PREFILLED_SYRINGE | Freq: Once | INTRAMUSCULAR | Status: DC

## 2022-08-11 MED ORDER — BENZOCAINE-MENTHOL 20-0.5 % EX AERO: 1.0000 | INHALATION_SPRAY | CUTANEOUS | Status: DC | PRN

## 2022-08-11 MED ORDER — ACETAMINOPHEN 325 MG PO TABS: 650.0000 mg | ORAL_TABLET | ORAL | Status: DC | PRN

## 2022-08-11 MED ORDER — FUROSEMIDE 20 MG PO TABS
20.0000 mg | ORAL_TABLET | Freq: Every day | ORAL | Status: DC
  Administered 2022-08-11 – 2022-08-12 (×2): 20 mg via ORAL
  Filled 2022-08-11 (×2): qty 1

## 2022-08-11 MED ORDER — OXYCODONE-ACETAMINOPHEN 5-325 MG PO TABS: 2.0000 | ORAL_TABLET | ORAL | Status: DC | PRN

## 2022-08-11 MED ORDER — OXYTOCIN BOLUS FROM INFUSION
333.0000 mL | Freq: Once | INTRAVENOUS | Status: AC
  Administered 2022-08-11: 333 mL via INTRAVENOUS

## 2022-08-11 MED ORDER — SENNOSIDES-DOCUSATE SODIUM 8.6-50 MG PO TABS
2.0000 | ORAL_TABLET | ORAL | Status: DC
  Administered 2022-08-11 – 2022-08-12 (×2): 2 via ORAL
  Filled 2022-08-11 (×2): qty 2

## 2022-08-11 MED ORDER — FUROSEMIDE 20 MG PO TABS: 20.0000 mg | ORAL_TABLET | Freq: Every day | ORAL | Status: DC

## 2022-08-11 MED ORDER — TERBUTALINE SULFATE 1 MG/ML IJ SOLN: 0.2500 mg | Freq: Once | INTRAMUSCULAR | Status: DC | PRN

## 2022-08-11 MED ORDER — LACTATED RINGERS IV SOLN: INTRAVENOUS | Status: DC

## 2022-08-11 MED ORDER — ACETAMINOPHEN 325 MG PO TABS
650.0000 mg | ORAL_TABLET | ORAL | Status: DC | PRN
  Administered 2022-08-11 (×2): 650 mg via ORAL
  Filled 2022-08-11 (×2): qty 2

## 2022-08-11 MED ORDER — FENTANYL CITRATE (PF) 100 MCG/2ML IJ SOLN: 50.0000 ug | INTRAMUSCULAR | Status: DC | PRN

## 2022-08-11 MED ORDER — DIBUCAINE (PERIANAL) 1 % EX OINT: 1.0000 | TOPICAL_OINTMENT | CUTANEOUS | Status: DC | PRN

## 2022-08-11 MED ORDER — ACETAMINOPHEN 325 MG PO TABS
650.0000 mg | ORAL_TABLET | ORAL | Status: DC
  Administered 2022-08-11 – 2022-08-12 (×3): 650 mg via ORAL
  Filled 2022-08-11 (×3): qty 2

## 2022-08-11 MED ORDER — DIPHENHYDRAMINE HCL 50 MG/ML IJ SOLN: 12.5000 mg | INTRAMUSCULAR | Status: DC | PRN

## 2022-08-11 MED ORDER — OXYCODONE HCL 5 MG PO TABS: 5.0000 mg | ORAL_TABLET | Freq: Once | ORAL | Status: DC | PRN

## 2022-08-11 MED ORDER — OXYTOCIN-SODIUM CHLORIDE 30-0.9 UT/500ML-% IV SOLN: 2.5000 [IU]/h | INTRAVENOUS | Status: DC

## 2022-08-11 MED ORDER — SODIUM CHLORIDE 0.9 % IV SOLN
3.0000 g | Freq: Once | INTRAVENOUS | Status: AC
  Administered 2022-08-11: 3 g via INTRAVENOUS
  Filled 2022-08-11 (×2): qty 8

## 2022-08-11 MED ORDER — LACTATED RINGERS IV SOLN
500.0000 mL | INTRAVENOUS | Status: DC | PRN
  Administered 2022-08-11: 1000 mL via INTRAVENOUS

## 2022-08-11 MED ORDER — DIPHENHYDRAMINE HCL 25 MG PO CAPS: 25.0000 mg | ORAL_CAPSULE | Freq: Four times a day (QID) | ORAL | Status: DC | PRN

## 2022-08-11 MED ORDER — TRANEXAMIC ACID-NACL 1000-0.7 MG/100ML-% IV SOLN
1000.0000 mg | INTRAVENOUS | Status: AC
  Administered 2022-08-11: 1000 mg via INTRAVENOUS

## 2022-08-11 MED ORDER — FENTANYL-BUPIVACAINE-NACL 0.5-0.125-0.9 MG/250ML-% EP SOLN
12.0000 mL/h | EPIDURAL | Status: DC | PRN
  Filled 2022-08-11: qty 250

## 2022-08-11 MED ORDER — LIDOCAINE HCL (PF) 1 % IJ SOLN
INTRAMUSCULAR | Status: DC | PRN
  Administered 2022-08-11: 2 mL via EPIDURAL
  Administered 2022-08-11: 10 mL via EPIDURAL

## 2022-08-11 MED ORDER — PHENYLEPHRINE 80 MCG/ML (10ML) SYRINGE FOR IV PUSH (FOR BLOOD PRESSURE SUPPORT): 80.0000 ug | PREFILLED_SYRINGE | INTRAVENOUS | Status: DC | PRN

## 2022-08-11 MED ORDER — AMLODIPINE BESYLATE 5 MG PO TABS
5.0000 mg | ORAL_TABLET | Freq: Every day | ORAL | Status: DC
  Administered 2022-08-11 – 2022-08-12 (×2): 5 mg via ORAL
  Filled 2022-08-11 (×2): qty 1

## 2022-08-11 MED ORDER — OXYCODONE HCL 5 MG PO TABS
5.0000 mg | ORAL_TABLET | Freq: Once | ORAL | Status: AC
  Administered 2022-08-11: 5 mg via ORAL
  Filled 2022-08-11: qty 1

## 2022-08-11 MED ORDER — SOD CITRATE-CITRIC ACID 500-334 MG/5ML PO SOLN: 30.0000 mL | ORAL | Status: DC | PRN

## 2022-08-11 MED ORDER — PRENATAL MULTIVITAMIN CH
1.0000 | ORAL_TABLET | Freq: Every day | ORAL | Status: DC
  Administered 2022-08-11 – 2022-08-12 (×2): 1 via ORAL
  Filled 2022-08-11 (×2): qty 1

## 2022-08-11 MED ORDER — OXYTOCIN-SODIUM CHLORIDE 30-0.9 UT/500ML-% IV SOLN
1.0000 m[IU]/min | INTRAVENOUS | Status: DC
  Administered 2022-08-11: 2 m[IU]/min via INTRAVENOUS
  Filled 2022-08-11: qty 500

## 2022-08-11 MED ORDER — ONDANSETRON HCL 4 MG/2ML IJ SOLN: 4.0000 mg | INTRAMUSCULAR | Status: DC | PRN

## 2022-08-11 MED ORDER — LIDOCAINE HCL (PF) 1 % IJ SOLN: 30.0000 mL | INTRAMUSCULAR | Status: DC | PRN

## 2022-08-11 NOTE — Progress Notes (Signed)
Spoke with Joellyn Haff, CNM regarding elevated BP. Will place new orders and requests we continue to call MD for blood pressure greater than 135/85 per protocol
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
## 2022-08-11 NOTE — Discharge Summary (Signed)
Postpartum Discharge Summary  Date of Service updated***     Patient Name: Tara Cohen DOB: 2001-04-02 MRN: [id number]  Date of admission: 08/11/2022 Delivery date:08/11/2022  Delivering provider: Concepcion Living  Date of discharge: 08/11/2022  Admitting diagnosis: Indication for care in labor or delivery [O75.9] Intrauterine pregnancy: [redacted]w[redacted]d    Secondary diagnosis:  Principal Problem:   Indication for care in labor or delivery Active Problems:   Pre-eclampsia, mild  Additional problems: none    Discharge diagnosis: Term Pregnancy Delivered and Preeclampsia (mild)                                              Post partum procedures: IV abx course due to uterine sweep Augmentation: AROM and Pitocin Complications: None  Hospital course: Onset of Labor With Vaginal Delivery      21y.o. yo G2P0101 at 355w3das admitted in Latent Labor on 08/11/2022. Labor course was complicated by pre-e without SF noted after delivery as she had mild range BPs at 4hrs apart after that point, as well as a P/C ratio of 0.37. Membrane Rupture Time/Date: 10:54 AM ,08/11/2022   Delivery Method:Vaginal, Spontaneous  Episiotomy: None  Lacerations:  None  Patient had a postpartum course complicated by receiving Lasix 2019m 5d; she did/did not *** require an antihypertensive.  She is ambulating, tolerating a regular diet, passing flatus, and urinating well. Patient is discharged home in stable condition on 08/11/22.  Newborn Data: Birth date:08/11/2022  Birth time:11:53 AM  Gender:Female  Living status:  Apgars: ,  Weight:   Magnesium Sulfate received: No BMZ received: No Rhophylac:N/A MMR:N/A T-DaP:Given prenatally Flu: No Transfusion:No  Physical exam  Vitals:   08/11/22 1131 08/11/22 1207 08/11/22 1216 08/11/22 1231  BP: (!) 152/99 (!) 122/93 124/72 128/65  Pulse: 81 80 73 78  Resp:  _0 Temp:      TempSrc:      SpO2:      Weight:      Height:       General:  {Exam; general:21111117} Lochia: {Desc; appropriate/inappropriate:30686::"appropriate"} Uterine Fundus: {Desc; firm/soft:30687} Incision: {Exam; incision:21111123} DVT Evaluation: {Exam; dvt:2111122} Labs: Lab Results  Component Value Date   WBC 7.6 08/11/2022   HGB 10.3 (L) 08/11/2022   HCT 33.2 (L) 08/11/2022   MCV 83.4 08/11/2022   PLT 244 08/11/2022      Latest Ref Rng & Units 08/11/2022    8:11 AM  CMP  Glucose 70 - 99 mg/dL 79   BUN 6 - 20 mg/dL 9   Creatinine 0.44 - 1.00 mg/dL 0.53   Sodium 135 - 145 mmol/L 137   Potassium 3.5 - 5.1 mmol/L 3.9   Chloride 98 - 111 mmol/L 107   CO2 22 - 32 mmol/L 19   Calcium 8.9 - 10.3 mg/dL 8.8   Total Protein 6.5 - 8.1 g/dL 6.1   Total Bilirubin 0.3 - 1.2 mg/dL 0.6   Alkaline Phos 38 - 126 U/L 200   AST 15 - 41 U/L 19   ALT 0 - 44 U/L 14    Edinburgh Score:    05/24/2021   11:20 AM  Edinburgh Postnatal Depression Scale Screening Tool  I have been able to laugh and see the funny side of things. 0  I have looked forward with enjoyment to things. 0  I have  blamed myself unnecessarily when things went wrong. 0  I have been anxious or worried for no good reason. 1  I have felt scared or panicky for no good reason. 0  Things have been getting on top of me. 0  I have been so unhappy that I have had difficulty sleeping. 0  I have felt sad or miserable. 0  I have been so unhappy that I have been crying. 0  The thought of harming myself has occurred to me. 0  Edinburgh Postnatal Depression Scale Total 1     After visit meds:  Allergies as of 08/11/2022       Reactions   Peanut-containing Drug Products Anaphylaxis     Med Rec must be completed prior to using this [facility]***        Discharge home in stable condition Infant Feeding: {Baby feeding:23562} Infant Disposition:{CHL IP OB HOME WITH WIOMBT:59741} Discharge instruction: per After Visit Summary and Postpartum booklet. Activity: Advance as tolerated. Pelvic  rest for 6 weeks.  Diet: routine diet Future Appointments: Future Appointments  Date Time Provider [place]  08/17/2022  3:35 PM Simona Huh [facility]  08/24/2022 10:15 AM Concepcion Living, MD [facility]  08/31/2022  3:15 PM Chancy Milroy, MD [facility]   Follow up Visit:  Myrtis Ser, CNM  P [facility] Admin Pool Please schedule this patient for Postpartum visit in: 6 weeks with the following provider: Any provider In-Person For C/S patients schedule nurse incision check in weeks 2 weeks: no High risk pregnancy complicated by: mild pre-e Delivery mode:  SVD Anticipated Birth Control:  other/unsure PP Procedures needed: BP check in 1wk- RN visit Schedule Integrated [place] visit: no   08/11/2022 Myrtis Ser, CNM

## 2022-08-11 NOTE — Anesthesia Procedure Notes (Signed)
Epidural Patient location during procedure: OB Start time: 08/11/2022 8:57 AM End time: 08/11/2022 9:04 AM  Staffing Anesthesiologist: Lannie Fields, DO Performed: anesthesiologist   Preanesthetic Checklist Completed: patient identified, IV checked, risks and benefits discussed, monitors and equipment checked, pre-op evaluation and timeout performed  Epidural Patient position: sitting Prep: DuraPrep and site prepped and draped Patient monitoring: continuous pulse ox, blood pressure, heart rate and cardiac monitor Approach: midline Location: L3-L4 Injection technique: LOR air  Needle:  Needle type: Tuohy  Needle gauge: 17 G Needle length: 9 cm Needle insertion depth: 6 cm Catheter type: closed end flexible Catheter size: 19 Gauge Catheter at skin depth: 11 cm Test dose: negative  Assessment Sensory level: T8 Events: blood not aspirated, no cerebrospinal fluid, injection not painful, no injection resistance, no paresthesia and negative IV test  Additional Notes Patient identified. Risks/Benefits/Options discussed with patient including but not limited to bleeding, infection, nerve damage, paralysis, failed block, incomplete pain control, headache, blood pressure changes, nausea, vomiting, reactions to medication both or allergic, itching and postpartum back pain. Confirmed with bedside nurse the patient's most recent platelet count. Confirmed with patient that they are not currently taking any anticoagulation, have any bleeding history or any family history of bleeding disorders. Patient expressed understanding and wished to proceed. All questions were answered. Sterile technique was used throughout the entire procedure. Please see nursing notes for vital signs. Test dose was given through epidural catheter and negative prior to continuing to dose epidural or start infusion. Warning signs of high block given to the patient including shortness of breath, tingling/numbness in  hands, complete motor block, or any concerning symptoms with instructions to call for help. Patient was given instructions on fall risk and not to get out of bed. All questions and concerns addressed with instructions to call with any issues or inadequate analgesia.  Reason for block:procedure for pain

## 2022-08-11 NOTE — MAU Provider Note (Signed)
Event Date/Time   First Provider Initiated Contact with Patient 08/11/22 0745     S: [name] is a 21 y.o. G2P0101 at [redacted]w[redacted]d  who presents to MAU today complaining contractions q 3-5 minutes since overnight, began to be stronger after her appointment yesterday. She endorses vaginal bleeding. She  is unsure about  LOF. She reports normal fetal movement.    Receives care at [facility], prenatal records reviewed. Was checked in the office yesterday and was 3cm. Initially desired waterbirth but was unsure on admission.  O: BP (!) 138/94 (BP Location: Right Arm)   Pulse 66   Temp 98.7 F (37.1 C) (Oral)   Resp 16   Ht 5\' 4"  (1.626 m)   Wt 181 lb 9.6 oz (82.4 kg)   LMP 12/28/2021 (Approximate)   SpO2 99% Comment: room air  BMI 31.17 kg/m  GENERAL: Well-developed, well-nourished female in no acute distress.  HEAD: Normocephalic, atraumatic.  CHEST: Normal effort of breathing, regular heart rate ABDOMEN: Soft, nontender, gravid  Cervical exam:  Dilation: 5 Effacement (%): 80 Station: -2 Presentation: Vertex Exam by:: J Hiran Leard CNM - More bleeding than typical for bloody show, dark red  Fetal Monitoring: reactive Baseline: 140 Variability: moderate Accelerations: 15x15 Decelerations: occasional variable Contractions: q3-38min per pt, not picking up well on monitor  A: SIUP at [redacted]w[redacted]d  Active labor  P: Admit to L&D for expectant management of active labor Risked out of waterbirth due to bleeding, pt understanding and requests epidural as soon as possible. Report given to L&D resident, Dr. [redacted]w[redacted]d and orders placed for admission  Cherly Hensen 08/11/2022 7:54 AM

## 2022-08-11 NOTE — MAU Note (Signed)
Tara Cohen is a 21 y.o. at [redacted]w[redacted]d here in MAU reporting: contractions started yesterday after her appointment. States pain comes every 3-5 minutes. States she thinks she leaked in the shower, saw watery with bleeding and saw a blood clot. +FM  Onset of complaint: yesterday  Pain score: 7/10  Vitals:   08/11/22 0734  BP: (!) 138/94  Pulse: 66  Resp: 16  Temp: 98.7 F (37.1 C)  SpO2: 99%     FHT:134  Lab orders placed from triage: none
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
## 2022-08-11 NOTE — Lactation Note (Addendum)
This note was copied from a baby's chart. Lactation Consultation Note  Patient Name: Tara Cohen VFIEP'P Date: 08/11/2022 Reason for consult: Early term 37-38.6wks;Infant < 6lbs, See Birth Parent MR- close space pregnancies.  Age:19 hours  P2, ETI female infant, SGA less than 6 lbs at birth. Per Birth Parent, infant been BF for 60 minutes to 30 minutes most  feedings, she not used DEBP she prefers to latch infant at the breast. LC suggested to Birth Parent to limit infant feedings at the breast to 15 minutes or less due infant weighing less than 6 lbs at birth and being ETI to reserve infant's energy. LC did not observe latch at this time due infant recently having 8 mls of formula at 2000 pm. LC discussed importance of using DEBP to help establish her milk supply and encouraged Birth Parent to pump every 3 hours for 15 minutes on initial setting and give infant back any EBM first before formula. Birth Parent was using the DEBP while LC  was in room, LC was hands off with Birth Parent and allow Birth Parent  to place breast  flange on breast.  Per Birth Parent, formula is seeping out the side of infant's mouth and infant doesn't like  the bottle, Birth Parent has been latching infant then at another feeding she may  offering  the bottle. LC gave Birth Parent White NFant nipple, discussed pace feeding infant and informed RN that infant may need SLP consult. Birth Parent understands Breast milk is safe at room temperature for 4 hours whereas formula must be used within 1 hour.  Current feeding plan: 1- Latch infant at the breast but limit chest feeding to 15 minutes and then afterwards based on infant's birth weight supplement infant with EBM/ 22 kcal formula ( 12-14 mls ) per feeding on Day 1 pace feeding infant using the White Nfant nipple.Infant feeding should be total of 30 minutes or less chest and bottle feeding, infant should feed 8x within 24 hours or every 3 hours.  2- Continue  to use DEBP every 3 hours for 15 minutes and give infant back any EBM first and then formula. 3- Birth Parent knows to call RN/LC if she has any BF questions, concerns or need latch assistance.    Maternal Data Does the patient have breastfeeding experience prior to this delivery?: Yes How long did the patient breastfeed?: per mom 1st baby was in NICU, pumped but not for very long.  Feeding Mother's Current Feeding Choice: Breast Milk and Formula  LATCH Score                    Lactation Tools Discussed/Used Tools: Pump;Flanges Flange Size: 24 Breast pump type: Double-Electric Breast Pump Pump Education: Setup, frequency, and cleaning;Milk Storage Reason for Pumping: Infant is less than 6 lbs, ETI Pumping frequency: Birth Parent was pumping when LC left the room and understands to pump every 3 hours for 15 minutes on inital setting.  Interventions Interventions: DEBP;LPT handout/interventions  Discharge Pump: Hands Free;Personal  Consult Status Consult Status: Follow-up Date: 08/12/22 Follow-up type: In-patient    Frederico Hamman 08/11/2022, 9:30 PM

## 2022-08-11 NOTE — Lactation Note (Signed)
This note was copied from a baby's chart. Lactation Consultation Note  Patient Name: Tara Cohen DUKGU'R Date: 08/11/2022 Reason for consult: Initial assessment;Early term 37-38.6wks;Infant < 6lbs (baby is 6 hours old , per mom recently fed from the bottle with lab work. LC reviewed information for Early term, less than  6 pounds and feeding plans. Per mom the baby BF both breast in L/D. DEBP set up, and due to 3 visitors unable to check flanges.) Age:60 hours  Maternal Data Does the patient have breastfeeding experience prior to this delivery?: Yes How long did the patient breastfeed?: per mom 1st baby was in NICU, pumped but not for very long.  Feeding Mother's Current Feeding Choice: Breast Milk and Formula Nipple Type: Extra Slow Flow  LATCH Score - mom aware to call with feeding cues and by 3 hours for latch assessment    Lactation Tools Discussed/Used Tools: Pump Breast pump type: Double-Electric Breast Pump Pump Education: Setup, frequency, and cleaning;Milk Storage Reason for Pumping: due to baby being less than 6 pounds, early term  Interventions Interventions: Breast feeding basics reviewed;Education;LC Services brochure;LPT handout/interventions  Discharge Pump: Hands Free;Personal  Consult Status Consult Status: Follow-up Date: 08/11/22 Follow-up type: In-patient    Matilde Sprang Maury Bamba 08/11/2022, 6:46 PM

## 2022-08-11 NOTE — Lactation Note (Signed)
This note was copied from a baby's chart. Lactation Consultation Note  Patient Name: Tara Cohen Today's Date: 08/11/2022 Reason for consult: L&D Initial assessment Age:19 hours  P2, 18 yr old mother. Baby cueing.  Helped guide baby deep on breast and baby sustained latch.  Sister in law assisted with latching. Lactation to follow up MBU.  Feeding Mother's Current Feeding Choice: Breast Milk  LATCH Score Latch: Repeated attempts needed to sustain latch, nipple held in mouth throughout feeding, stimulation needed to elicit sucking reflex.  Audible Swallowing: A few with stimulation  Type of Nipple: Everted at rest and after stimulation  Comfort (Breast/Nipple): Soft / non-tender  Hold (Positioning): Assistance needed to correctly position infant at breast and maintain latch.  LATCH Score: 7   Interventions Interventions: Assisted with latch;Skin to skin;Education   Consult Status Consult Status: Follow-up from L&D    Dahlia Byes [facility] 08/11/2022, 12:44 PM

## 2022-08-12 ENCOUNTER — Other Ambulatory Visit (HOSPITAL_COMMUNITY): Payer: Self-pay

## 2022-08-12 MED ORDER — FUROSEMIDE 20 MG PO TABS
20.0000 mg | ORAL_TABLET | Freq: Every day | ORAL | 0 refills | Status: DC
  Filled 2022-08-12: qty 3, 3d supply, fill #0

## 2022-08-12 MED ORDER — AMLODIPINE BESYLATE 5 MG PO TABS: 5.0000 mg | ORAL_TABLET | Freq: Every day | ORAL | 0 refills | Status: DC

## 2022-08-12 MED ORDER — AMLODIPINE BESYLATE 5 MG PO TABS
5.0000 mg | ORAL_TABLET | Freq: Every day | ORAL | 0 refills | Status: DC
  Filled 2022-08-12: qty 30, 30d supply, fill #0

## 2022-08-12 MED ORDER — FUROSEMIDE 20 MG PO TABS: 20.0000 mg | ORAL_TABLET | Freq: Every day | ORAL | 0 refills | Status: DC

## 2022-08-12 MED ORDER — IBUPROFEN 600 MG PO TABS: 600.0000 mg | ORAL_TABLET | Freq: Four times a day (QID) | ORAL | 0 refills | Status: DC | PRN

## 2022-08-12 NOTE — Discharge Instructions (Signed)
Call the office or go to [facility] hospital for these signs of pre-eclampsia: Severe headache that does not go away with Tylenol Visual changes- seeing spots, double, blurred vision Pain under your right breast or upper abdomen that does not go away with Tums or heartburn medicine Nausea and/or vomiting Severe swelling in your hands, feet, and face

## 2022-08-12 NOTE — Anesthesia Postprocedure Evaluation (Signed)
Anesthesia Post Note  Patient: Tara Cohen  Procedure(s) Performed: AN AD HOC LABOR EPIDURAL     Patient location during evaluation: Mother Baby Anesthesia Type: Epidural Level of consciousness: awake and alert and oriented Pain management: satisfactory to patient Vital Signs Assessment: post-procedure vital signs reviewed and stable Respiratory status: respiratory function stable Cardiovascular status: stable Postop Assessment: no headache, no backache, epidural receding, patient able to bend at knees, no signs of nausea or vomiting, adequate PO intake and able to ambulate Anesthetic complications: no   No notable events documented.  Last Vitals:  Vitals:   08/12/22 0148 08/12/22 0544  BP: 114/74 118/72  Pulse: 63 65  Resp: 17 16  Temp: 36.7 C 36.8 C  SpO2:      Last Pain:  Vitals:   08/12/22 0720  TempSrc:   PainSc: 0-No pain   Pain Goal: Patients Stated Pain Goal: 0 (08/11/22 0865)                 Karleen Dolphin
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
## 2022-08-14 IMAGING — US US OB COMP LESS 14 WK
1 series · 15 of 28 positions shown · non-contrast
Comparison: None Available.

CLINICAL DATA: Left flank pain for 1 week. Estimated gestational
age by LMP is 5 weeks 3 days. Quantitative beta HCG is not provided.

EXAM:
OBSTETRIC <14 WK ULTRASOUND
TECHNIQUE: Transabdominal ultrasound was performed for evaluation of the
gestation as well as the maternal uterus and adnexal regions.

[Series 1: us ob comp less 14 wk · 39 acquisitions, 15 frames shown]
[im 1/39]
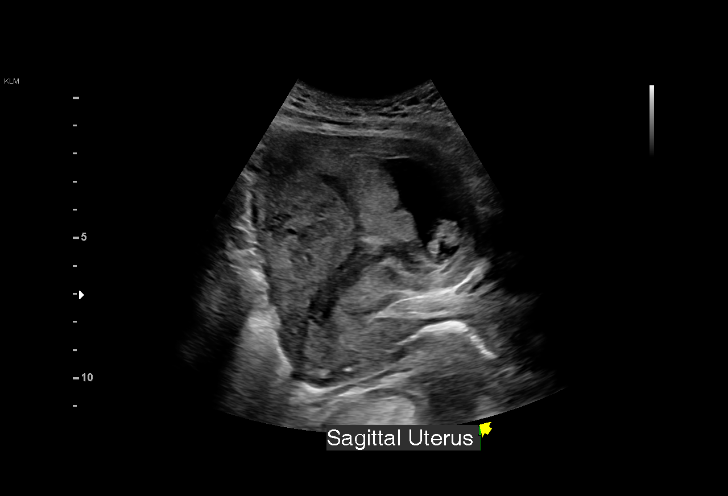
[im 3/39]
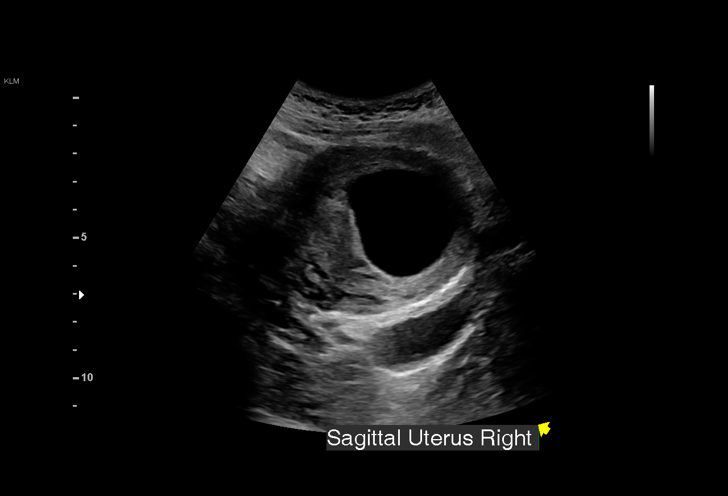
[im 6/39]
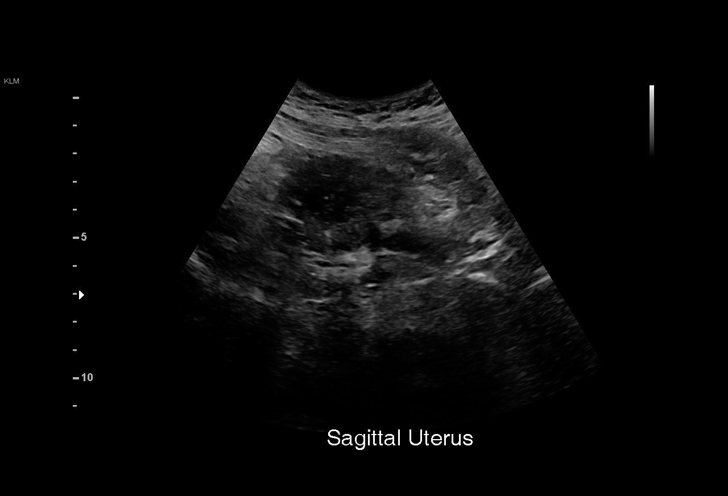
[im 9/39]
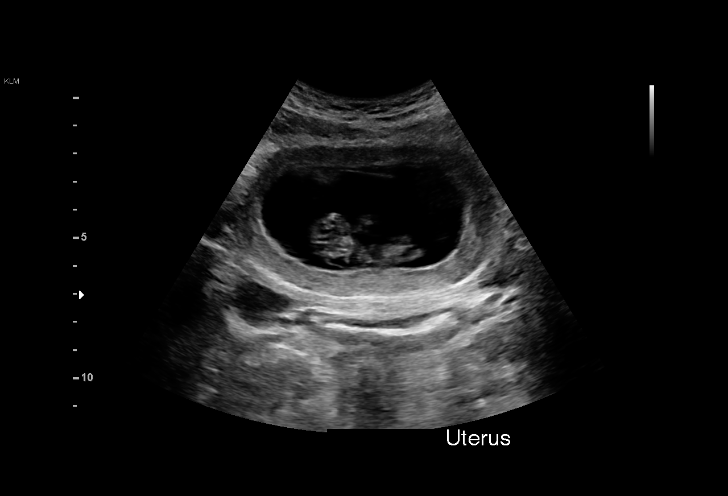
[im 12/39]
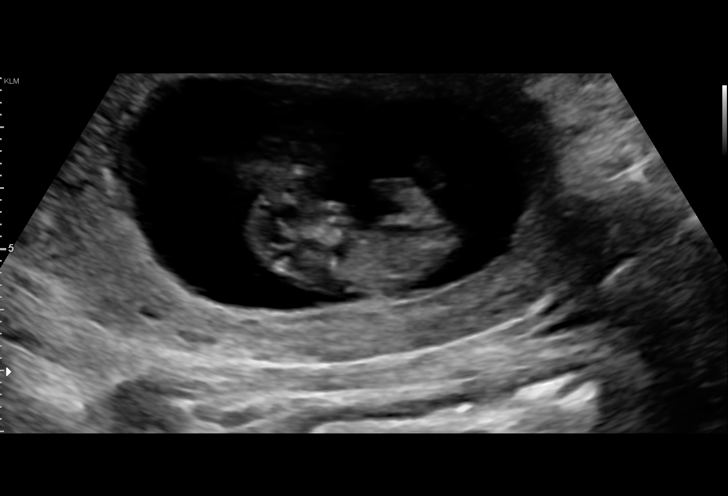
[im 15/39]
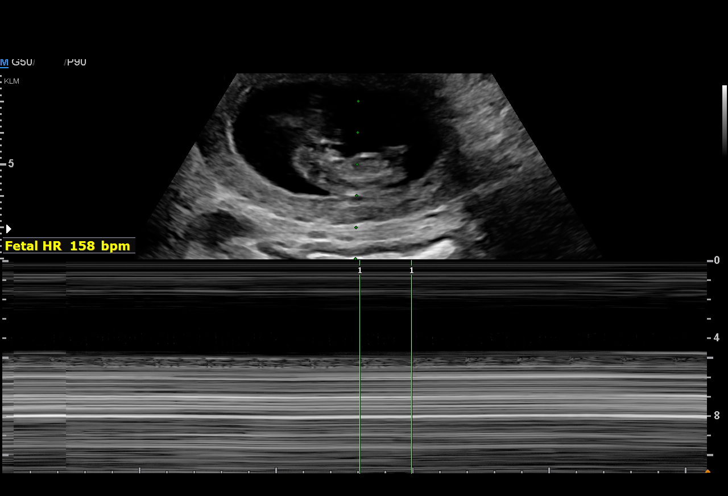
[im 17/39]
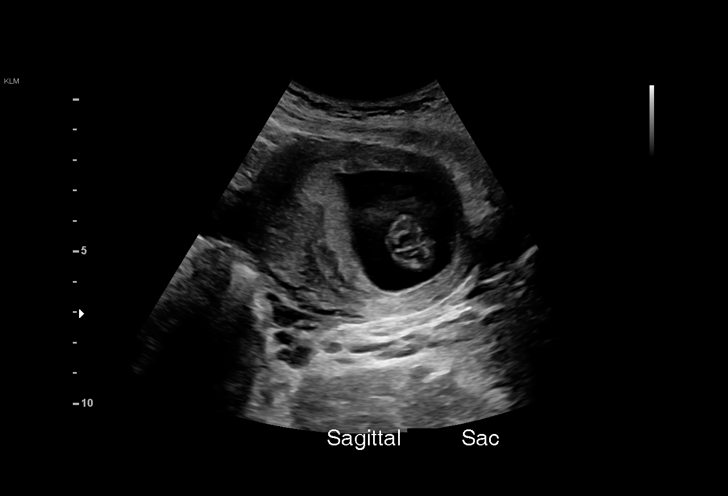
[im 20/39]
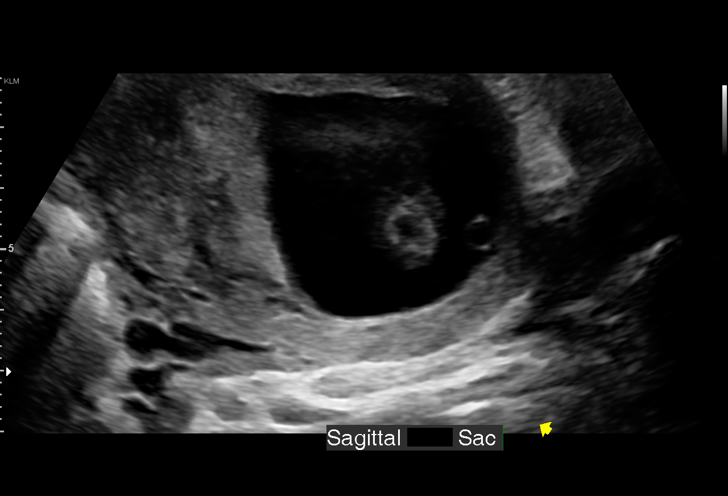
[im 22/39]
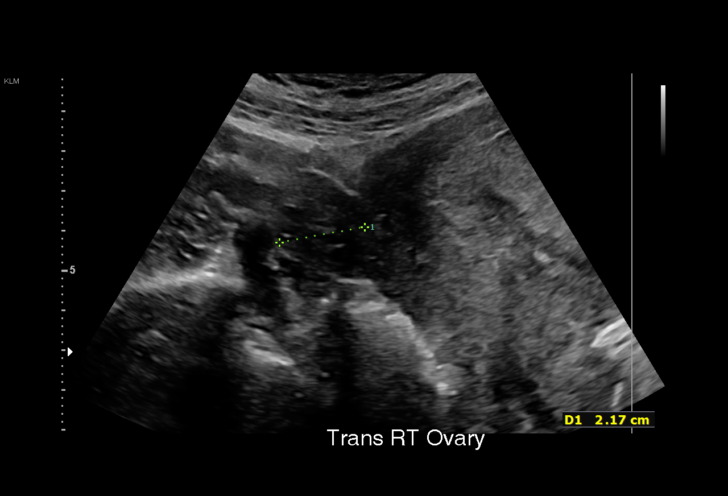
[im 24/39]
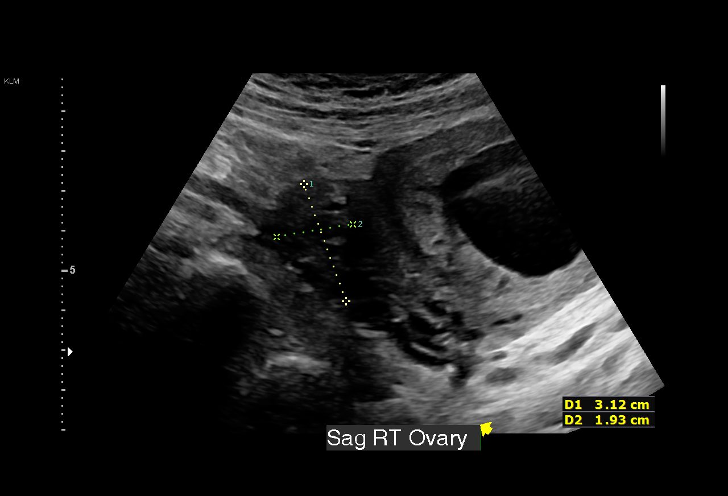
[im 27/39]
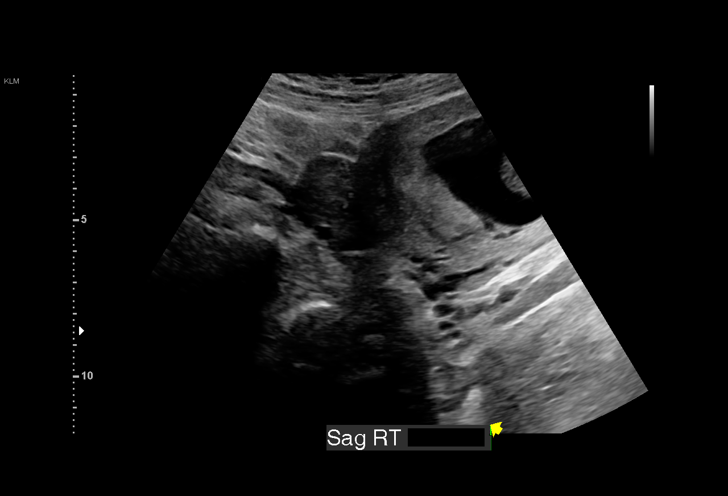
[im 30/39]
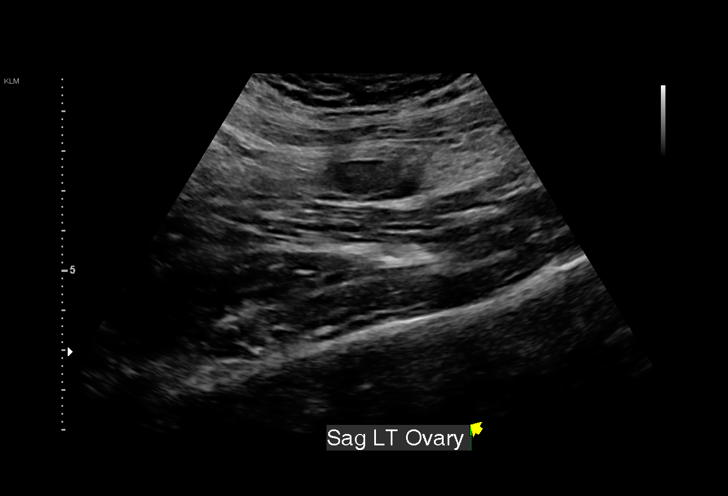
[im 33/39]
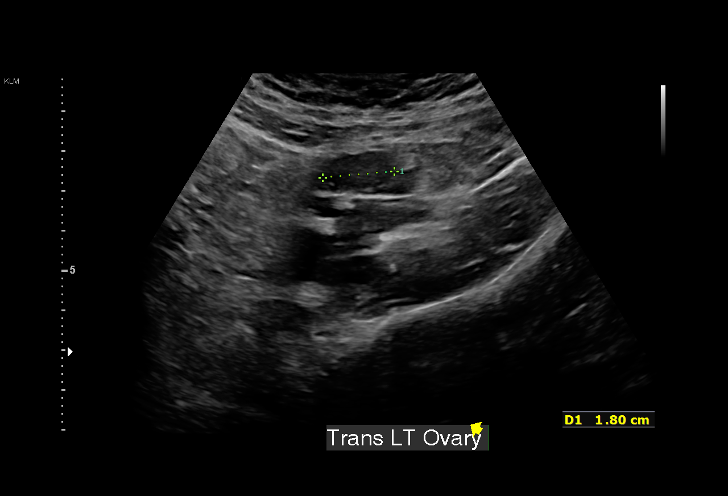
[im 36/39]
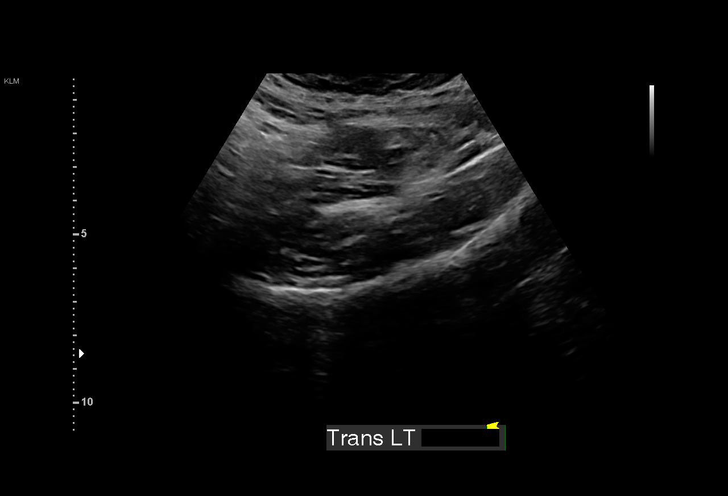
[im 39/39]
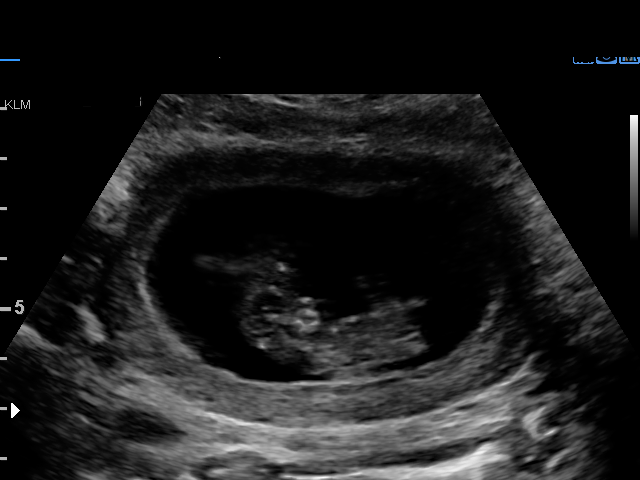

[15 of 28 positions shown; findings below may reference images not displayed]

FINDINGS: Intrauterine gestational sac: A single intrauterine gestational sac
is present.

Yolk sac:  The yolk sac is visualized.

Embryo:  Fetal pole is present.

Cardiac Activity: Fetal cardiac activity is observed.

Heart Rate: 158 bpm

CRL:   37 mm   10 w 4 d                  US EDC: 08/29/2022

Subchorionic hemorrhage:  None visualized.

Maternal uterus/adnexae: No myometrial mass lesions are identified.
Both ovaries are visualized and appear normal. No abnormal adnexal
masses. No free fluid in the pelvis.
IMPRESSION: Single intrauterine pregnancy. Estimated gestational age by
crown-rump length is 10 weeks 4 days. No acute complication is
demonstrated sonographically.

## 2022-08-17 ENCOUNTER — Encounter: Payer: Self-pay | Admitting: Advanced Practice Midwife

## 2022-08-18 ENCOUNTER — Ambulatory Visit: Payer: Medicaid Other

## 2022-08-18 ENCOUNTER — Telehealth: Payer: Self-pay

## 2022-08-18 NOTE — Telephone Encounter (Signed)
Called pt to follow up on missed visit this AM. VM left requesting patient call back to reschedule.

## 2022-08-18 NOTE — Progress Notes (Deleted)
Blood Pressure Check Visit  Tara Cohen is here for blood pressure check following vaginal delivery on 08/11/22 in setting of pre-e. Discharged from hospital on Norvasc 5 mg daily and short course of Lasix. BP today is ***. Patient denies/endorses any {Symptoms; hypertension cc:11129:s:"dizzness","blurred vision","headache","shortness of breath","peripheral edema"}. Reviewed with ***.  Marjo Bicker, RN 08/18/2022  8:13 AM

## 2022-08-19 ENCOUNTER — Telehealth (HOSPITAL_COMMUNITY): Payer: Self-pay | Admitting: *Deleted

## 2022-08-19 NOTE — Telephone Encounter (Signed)
Left phone voicemail message.  Duffy Rhody, RN 08-19-2022 at 1:01pm

## 2022-08-20 ENCOUNTER — Encounter: Payer: Self-pay | Admitting: *Deleted

## 2022-08-24 ENCOUNTER — Encounter: Payer: Self-pay | Admitting: Family Medicine

## 2022-08-31 ENCOUNTER — Encounter: Payer: Self-pay | Admitting: Obstetrics and Gynecology

## 2022-09-21 ENCOUNTER — Ambulatory Visit: Payer: Self-pay | Admitting: Family Medicine

## 2022-11-16 ENCOUNTER — Ambulatory Visit: Payer: Medicaid Other | Admitting: Family Medicine

## 2022-11-24 ENCOUNTER — Ambulatory Visit: Payer: Medicaid Other | Admitting: Family Medicine

## 2023-04-27 ENCOUNTER — Encounter (HOSPITAL_COMMUNITY): Payer: Self-pay

## 2023-04-27 ENCOUNTER — Ambulatory Visit (HOSPITAL_COMMUNITY)
Admission: EM | Admit: 2023-04-27 | Discharge: 2023-04-27 | Disposition: A | Discharge status: Home / Self Care | Payer: Medicaid Other | Attending: Internal Medicine | Admitting: Internal Medicine

## 2023-04-27 DIAGNOSIS — H109 Unspecified conjunctivitis: Secondary | ICD-10-CM

## 2023-04-27 DIAGNOSIS — B9689 Other specified bacterial agents as the cause of diseases classified elsewhere: Secondary | ICD-10-CM | POA: Diagnosis not present

## 2023-04-27 MED ORDER — POLYMYXIN B-TRIMETHOPRIM 10000-0.1 UNIT/ML-% OP SOLN: 1.0000 [drp] | Freq: Three times a day (TID) | OPHTHALMIC | 0 refills | Status: DC
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
## 2023-04-27 NOTE — ED Triage Notes (Signed)
Patient here today with c/o irritation to both her eyes that started yesterday. She has been having redness, swelling, drainage, and pain. She tried a cold compress, warm tea bag, and artificial tears with no relief.

## 2024-02-18 ENCOUNTER — Ambulatory Visit

## 2024-02-18 DIAGNOSIS — Z3201 Encounter for pregnancy test, result positive: Secondary | ICD-10-CM

## 2024-02-18 LAB — POCT PREGNANCY, URINE: Preg Test, Ur: POSITIVE — AB

## 2024-02-18 NOTE — Progress Notes (Signed)
 Pt left urine for walk in UPT resulting positive.   Pt denies VB and pain.   Pt reports LMP 01/08/24 which makes her EDD 10/14/24 and 5w 6d today.  Medications and allergies reviewed.  Pt encouraged to start taking PNV and start OB care.  Pt provided with OB providers and list of medications that is safe to take in pregnancy.  Pt advised when to go to MAU for evaluation.  Pt verbalized understanding with no further questions.   Tarra Pence,RN  02/18/24

## 2024-02-18 NOTE — Patient Instructions (Addendum)
[facility] Prenatal Care Providers          [facility] locations:  Hours may vary. Please call for an appointment  [facility] at [facility]             [address], [place], [place] [address] [phone]  [facility] at [facility]                                                             [address], [address], [place], [place], [address] [phone]  [facility] at [facility]                                    [address], [address], [place], [place], [address] [phone]  [facility] at [place] [address], [address], [place], [place], [address] [phone]  [facility] at [facility]                                 [address], [place], [place], [address] [phone]  [facility] at [facility]                                    [address], [place], [place], [address] [phone]  [facility] at [facility] [address], [address], [place], [place], [address]                                                   Safe Medications in Pregnancy    Acne: Benzoyl Peroxide Salicylic Acid  Backache/Headache: Tylenol: 2 regular strength every 4 hours OR              2 Extra strength every 6 hours  Colds/Coughs/Allergies: Benadryl (alcohol free) 25 mg every 6 hours as needed Breath right strips Claritin Cepacol throat lozenges Chloraseptic throat spray Cold-Eeze- up to three times per day Cough drops, alcohol free Flonase (by prescription only) Guaifenesin Mucinex Robitussin DM (plain only, alcohol free) Saline nasal spray/drops Sudafed (pseudoephedrine) & Actifed ** use only after [redacted] weeks gestation and if you do not have high blood pressure Tylenol Vicks Vaporub Zinc lozenges Zyrtec   Constipation: Colace Ducolax suppositories Fleet enema Glycerin  suppositories Metamucil Milk of magnesia Miralax Senokot Smooth move tea  Diarrhea: Kaopectate Imodium A-D  *NO pepto Bismol  Hemorrhoids: Anusol Anusol HC Preparation H Tucks  Indigestion: Tums Maalox Mylanta Zantac  Pepcid  Insomnia: Benadryl (alcohol free) 25mg every 6 hours as needed Tylenol PM Unisom, no Gelcaps  Leg Cramps: Tums MagGel  Nausea/Vomiting:  Bonine Dramamine Emetrol Ginger extract Sea bands Meclizine  Nausea medication to take during pregnancy:  Unisom (doxylamine succinate 25 mg tablets) Take one tablet daily at bedtime. If symptoms are not adequately controlled, the dose can be increased to a maximum recommended dose of two   tablets daily (1/2 tablet in the morning, 1/2 tablet mid-afternoon and one at bedtime). Vitamin B6 100mg tablets. Take one tablet twice a day (up to 200 mg per day).  Skin Rashes: Aveeno products Benadryl cream or 25mg every 6 hours as needed Calamine Lotion 1% cortisone cream  Yeast infection: Gyne-lotrimin 7 Monistat 7   **If taking multiple medications, please check labels to avoid duplicating the same active ingredients **take medication as directed on the label ** Do not exceed 4000 mg of tylenol in 24 hours **Do not take medications that contain aspirin or ibuprofen    

## 2024-03-10 ENCOUNTER — Encounter (HOSPITAL_COMMUNITY): Payer: Self-pay

## 2024-03-10 ENCOUNTER — Inpatient Hospital Stay (HOSPITAL_COMMUNITY)
Admission: AD | Admit: 2024-03-10 | Discharge: 2024-03-11 | Disposition: A | Discharge status: Home / Self Care | Attending: Obstetrics and Gynecology | Admitting: Obstetrics and Gynecology

## 2024-03-10 ENCOUNTER — Inpatient Hospital Stay (HOSPITAL_COMMUNITY)

## 2024-03-10 DIAGNOSIS — O4691 Antepartum hemorrhage, unspecified, first trimester: Secondary | ICD-10-CM | POA: Insufficient documentation

## 2024-03-10 DIAGNOSIS — O3680X0 Pregnancy with inconclusive fetal viability, not applicable or unspecified: Secondary | ICD-10-CM | POA: Diagnosis not present

## 2024-03-10 DIAGNOSIS — R109 Unspecified abdominal pain: Secondary | ICD-10-CM | POA: Diagnosis present

## 2024-03-10 DIAGNOSIS — Z3A08 8 weeks gestation of pregnancy: Secondary | ICD-10-CM

## 2024-03-10 DIAGNOSIS — O0990 Supervision of high risk pregnancy, unspecified, unspecified trimester: Secondary | ICD-10-CM

## 2024-03-10 LAB — CBC
HCT: 40.1 % (ref 36.0–46.0)
Hemoglobin: 13 g/dL (ref 12.0–15.0)
MCH: 28.4 pg (ref 26.0–34.0)
MCHC: 32.4 g/dL (ref 30.0–36.0)
MCV: 87.7 fL (ref 80.0–100.0)
Platelets: 303 10*3/uL (ref 150–400)
RBC: 4.57 MIL/uL (ref 3.87–5.11)
RDW: 12.7 % (ref 11.5–15.5)
WBC: 10.2 10*3/uL (ref 4.0–10.5)
nRBC: 0 % (ref 0.0–0.2)

## 2024-03-10 LAB — COMPREHENSIVE METABOLIC PANEL WITH GFR
ALT: 13 U/L (ref 0–44)
AST: 20 U/L (ref 15–41)
Albumin: 3.9 g/dL (ref 3.5–5.0)
Alkaline Phosphatase: 45 U/L (ref 38–126)
Anion gap: 9 (ref 5–15)
BUN: 7 mg/dL (ref 6–20)
CO2: 22 mmol/L (ref 22–32)
Calcium: 9.3 mg/dL (ref 8.9–10.3)
Chloride: 106 mmol/L (ref 98–111)
Creatinine, Ser: 0.56 mg/dL (ref 0.44–1.00)
GFR, Estimated: >60 mL/min (ref >60)
Glucose, Bld: 87 mg/dL (ref 70–99)
Potassium: 4 mmol/L (ref 3.5–5.1)
Sodium: 137 mmol/L (ref 135–145)
Total Bilirubin: 0.6 mg/dL (ref 0.0–1.2)
Total Protein: 7 g/dL (ref 6.5–8.1)

## 2024-03-10 LAB — ABO/RH: ABO/RH(D): B POS

## 2024-03-10 LAB — URINALYSIS, ROUTINE W REFLEX MICROSCOPIC
Bacteria, UA: NONE SEEN
Bilirubin Urine: NEGATIVE
Glucose, UA: NEGATIVE mg/dL
Ketones, ur: NEGATIVE mg/dL
Leukocytes,Ua: NEGATIVE
Nitrite: NEGATIVE
Protein, ur: NEGATIVE mg/dL
RBC / HPF: >50 RBC/hpf (ref 0–5)
Specific Gravity, Urine: 1.016 (ref 1.005–1.030)
pH: 5 (ref 5.0–8.0)

## 2024-03-10 LAB — WET PREP, GENITAL
Clue Cells Wet Prep HPF POC: NONE SEEN
Sperm: NONE SEEN
Trich, Wet Prep: NONE SEEN
WBC, Wet Prep HPF POC: >=10 — AB (ref <10)
Yeast Wet Prep HPF POC: NONE SEEN

## 2024-03-10 LAB — HCG, QUANTITATIVE, PREGNANCY: hCG, Beta Chain, Quant, S: 4601 m[IU]/mL — ABNORMAL HIGH (ref <5)

## 2024-03-10 MED ORDER — KETOROLAC TROMETHAMINE 60 MG/2ML IM SOLN
60.0000 mg | Freq: Once | INTRAMUSCULAR | Status: AC
  Administered 2024-03-10: 60 mg via INTRAMUSCULAR
  Filled 2024-03-10: qty 2

## 2024-03-10 MED ORDER — OXYCODONE HCL 5 MG PO TABS: 5.0000 mg | ORAL_TABLET | ORAL | 0 refills | Status: AC | PRN

## 2024-03-10 MED ORDER — ONDANSETRON 4 MG PO TBDP: 4.0000 mg | ORAL_TABLET | Freq: Four times a day (QID) | ORAL | 0 refills | Status: AC | PRN

## 2024-03-10 MED ORDER — IBUPROFEN 800 MG PO TABS: 800.0000 mg | ORAL_TABLET | Freq: Three times a day (TID) | ORAL | 0 refills | Status: AC | PRN

## 2024-03-10 MED ORDER — OXYCODONE HCL 5 MG PO TABS: 5.0000 mg | ORAL_TABLET | ORAL | 0 refills | Status: DC | PRN

## 2024-03-10 MED ORDER — IBUPROFEN 800 MG PO TABS: 800.0000 mg | ORAL_TABLET | Freq: Three times a day (TID) | ORAL | 0 refills | Status: DC | PRN

## 2024-03-10 MED ORDER — ONDANSETRON 4 MG PO TBDP: 4.0000 mg | ORAL_TABLET | Freq: Four times a day (QID) | ORAL | 0 refills | Status: DC | PRN

## 2024-03-10 NOTE — MAU Note (Signed)
 Pt says she has VB - started 510pm- went to [facility] running down her legs - blood clots- size of quarter in toilet . Paper towels in underwear - in Triage - mod . Cramping started yesterday at 7 pm- were mild 2/10. Pain  now - 9/10
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
## 2024-03-13 ENCOUNTER — Ambulatory Visit: Payer: Self-pay

## 2024-03-13 NOTE — Progress Notes (Deleted)
 Beta HCG Follow-up Visit  Tara Cohen presents to [facility] for follow-up beta HCG lab. She was seen in MAU for vaginal bleeding on 03/10/24. Patient {Actions; denies-reports:120008} {pain/bleeding:25812} today. Discussed with patient that we are following beta HCG levels today. Results will be back in approximately 2 hours. Valid contact number for patient confirmed. I will call the patient with results.   Beta HCG results: 03/10/24 MAU  4,601   03/13/24       Results and patient history reviewed with ***, who states ***. Patient called and informed of plan for follow-up.  Waddell LITTIE Burows 03/13/2024 8:06 AM

## 2024-03-14 ENCOUNTER — Telehealth (HOSPITAL_COMMUNITY): Payer: Self-pay

## 2024-03-14 ENCOUNTER — Ambulatory Visit (HOSPITAL_COMMUNITY): Payer: Self-pay

## 2024-03-14 LAB — GC/CHLAMYDIA PROBE AMP (~~LOC~~) NOT AT ARMC
Chlamydia: POSITIVE — AB
Comment: NEGATIVE
Comment: NORMAL
Neisseria Gonorrhea: NEGATIVE

## 2024-03-14 MED ORDER — AZITHROMYCIN 500 MG PO TABS: 1000.0000 mg | ORAL_TABLET | Freq: Once | ORAL | 0 refills | Status: AC

## 2024-03-14 NOTE — Telephone Encounter (Signed)
 This patient tested positive for Chlamydia   She is allergic to Peanuts I have informed the patient of her results and confirmed her pharmacy is correct in her chart. Treatment has been sent per protocol.   Arlander Katheryn RAMAN, RN

## 2024-03-21 ENCOUNTER — Telehealth

## 2024-03-21 NOTE — Progress Notes (Deleted)
Pt did not show for virtual visit  

## 2024-03-27 ENCOUNTER — Encounter: Payer: Self-pay | Admitting: Obstetrics and Gynecology

## 2024-03-30 ENCOUNTER — Ambulatory Visit: Admitting: Student

## 2024-05-18 ENCOUNTER — Ambulatory Visit: Admitting: Obstetrics and Gynecology
